# Patient Record
Sex: Male | Born: 1980
Health system: Southern US, Community
[De-identification: ages and names within clinical notes are randomized; demographics above are authoritative.]

## PROBLEM LIST (undated history)

## (undated) DIAGNOSIS — F411 Generalized anxiety disorder: Secondary | ICD-10-CM

## (undated) DIAGNOSIS — F319 Bipolar disorder, unspecified: Secondary | ICD-10-CM

## (undated) DIAGNOSIS — L0390 Cellulitis, unspecified: Secondary | ICD-10-CM

## (undated) DIAGNOSIS — F909 Attention-deficit hyperactivity disorder, unspecified type: Secondary | ICD-10-CM

## (undated) DIAGNOSIS — K5792 Diverticulitis of intestine, part unspecified, without perforation or abscess without bleeding: Secondary | ICD-10-CM

## (undated) HISTORY — PX: KNEE SURGERY: SHX244

## (undated) HISTORY — PX: FRACTURE SURGERY: SHX138

---

## 2005-01-24 ENCOUNTER — Emergency Department (HOSPITAL_COMMUNITY): Admission: EM | Admit: 2005-01-24 | Discharge: 2005-01-24 | Payer: Self-pay | Admitting: Emergency Medicine
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
## 2007-11-19 ENCOUNTER — Emergency Department (HOSPITAL_BASED_OUTPATIENT_CLINIC_OR_DEPARTMENT_OTHER): Admission: EM | Admit: 2007-11-19 | Discharge: 2007-11-19 | Payer: Self-pay | Admitting: Emergency Medicine

## 2008-04-15 ENCOUNTER — Emergency Department (HOSPITAL_BASED_OUTPATIENT_CLINIC_OR_DEPARTMENT_OTHER): Admission: EM | Admit: 2008-04-15 | Discharge: 2008-04-15 | Payer: Self-pay | Admitting: Emergency Medicine

## 2008-04-15 ENCOUNTER — Ambulatory Visit: Payer: Self-pay | Admitting: Radiology

## 2008-06-21 ENCOUNTER — Ambulatory Visit: Payer: Self-pay | Admitting: Diagnostic Radiology

## 2008-06-21 ENCOUNTER — Emergency Department (HOSPITAL_BASED_OUTPATIENT_CLINIC_OR_DEPARTMENT_OTHER): Admission: EM | Admit: 2008-06-21 | Discharge: 2008-06-21 | Payer: Self-pay | Admitting: Emergency Medicine

## 2008-07-03 ENCOUNTER — Ambulatory Visit: Payer: Self-pay | Admitting: Diagnostic Radiology

## 2008-07-03 ENCOUNTER — Emergency Department (HOSPITAL_BASED_OUTPATIENT_CLINIC_OR_DEPARTMENT_OTHER): Admission: EM | Admit: 2008-07-03 | Discharge: 2008-07-03 | Payer: Self-pay | Admitting: Emergency Medicine

## 2009-02-16 ENCOUNTER — Emergency Department (HOSPITAL_BASED_OUTPATIENT_CLINIC_OR_DEPARTMENT_OTHER): Admission: EM | Admit: 2009-02-16 | Discharge: 2009-02-16 | Payer: Self-pay | Admitting: Emergency Medicine

## 2009-02-16 ENCOUNTER — Ambulatory Visit: Payer: Self-pay | Admitting: Radiology

## 2009-05-19 ENCOUNTER — Emergency Department (HOSPITAL_BASED_OUTPATIENT_CLINIC_OR_DEPARTMENT_OTHER): Admission: EM | Admit: 2009-05-19 | Discharge: 2009-05-19 | Payer: Self-pay | Admitting: Emergency Medicine

## 2009-06-18 ENCOUNTER — Emergency Department (HOSPITAL_BASED_OUTPATIENT_CLINIC_OR_DEPARTMENT_OTHER): Admission: EM | Admit: 2009-06-18 | Discharge: 2009-06-18 | Payer: Self-pay | Admitting: Emergency Medicine

## 2010-05-22 LAB — CBC
MCHC: 34.5 g/dL (ref 30.0–36.0)
RBC: 5.13 MIL/uL (ref 4.22–5.81)
RDW: 12.3 % (ref 11.5–15.5)

## 2010-05-22 LAB — BASIC METABOLIC PANEL
CO2: 28 mEq/L (ref 19–32)
Calcium: 9.7 mg/dL (ref 8.4–10.5)
Creatinine, Ser: 1 mg/dL (ref 0.4–1.5)
GFR calc Af Amer: >60 mL/min (ref >60)
Glucose, Bld: 114 mg/dL — ABNORMAL HIGH (ref 70–99)

## 2010-05-22 LAB — DIFFERENTIAL
Basophils Absolute: 0 10*3/uL (ref 0.0–0.1)
Basophils Relative: 1 % (ref 0–1)
Neutro Abs: 4.7 10*3/uL (ref 1.7–7.7)
Neutrophils Relative %: 66 % (ref 43–77)

## 2010-05-22 LAB — POCT TOXICOLOGY PANEL

## 2010-05-22 LAB — POCT CARDIAC MARKERS
CKMB, poc: <1 ng/mL — ABNORMAL LOW (ref 1.0–8.0)
Myoglobin, poc: 46.5 ng/mL (ref 12–200)

## 2015-06-04 ENCOUNTER — Emergency Department (HOSPITAL_BASED_OUTPATIENT_CLINIC_OR_DEPARTMENT_OTHER)
Admission: EM | Admit: 2015-06-04 | Discharge: 2015-06-04 | Disposition: A | Discharge status: Home / Self Care | Payer: No Typology Code available for payment source | Attending: Emergency Medicine | Admitting: Emergency Medicine

## 2015-06-04 ENCOUNTER — Encounter (HOSPITAL_BASED_OUTPATIENT_CLINIC_OR_DEPARTMENT_OTHER): Payer: Self-pay | Admitting: *Deleted

## 2015-06-04 DIAGNOSIS — K0381 Cracked tooth: Secondary | ICD-10-CM | POA: Diagnosis not present

## 2015-06-04 DIAGNOSIS — F17200 Nicotine dependence, unspecified, uncomplicated: Secondary | ICD-10-CM | POA: Insufficient documentation

## 2015-06-04 DIAGNOSIS — K0889 Other specified disorders of teeth and supporting structures: Secondary | ICD-10-CM | POA: Insufficient documentation

## 2015-06-04 DIAGNOSIS — Z79899 Other long term (current) drug therapy: Secondary | ICD-10-CM | POA: Diagnosis not present

## 2015-06-04 MED ORDER — ONDANSETRON 4 MG PO TBDP
4.0000 mg | ORAL_TABLET | Freq: Once | ORAL | Status: AC
  Administered 2015-06-04: 4 mg via ORAL
  Filled 2015-06-04: qty 1

## 2015-06-04 MED ORDER — MORPHINE SULFATE (PF) 4 MG/ML IV SOLN
6.0000 mg | Freq: Once | INTRAVENOUS | Status: AC
  Administered 2015-06-04: 6 mg via INTRAMUSCULAR
  Filled 2015-06-04: qty 2

## 2015-06-04 MED ORDER — ACETAMINOPHEN 500 MG PO TABS
1000.0000 mg | ORAL_TABLET | Freq: Once | ORAL | Status: AC
  Administered 2015-06-04: 1000 mg via ORAL
  Filled 2015-06-04: qty 2

## 2015-06-04 MED ORDER — TRAMADOL HCL 50 MG PO TABS
50.0000 mg | ORAL_TABLET | Freq: Two times a day (BID) | ORAL | Status: DC | PRN
Start: 2015-06-04 — End: 2016-12-25

## 2015-06-04 MED ORDER — TRAMADOL HCL 50 MG PO TABS
100.0000 mg | ORAL_TABLET | Freq: Once | ORAL | Status: DC
  Filled 2015-06-04: qty 2

## 2015-06-04 NOTE — ED Notes (Signed)
MD with pt  

## 2015-06-04 NOTE — Discharge Instructions (Signed)
Dental Pain John Pierce, You are experiencing pain because your cracked tooth is now down to the nerve.  There is no infection there currently. Continue to take ibuprofen 800mg  every 8 hours for pain.  If the pain becomes severe, take tramadol.  See your dentist within 3 days for close follow up. If symptoms worsen, come back to the ED immediately. Thank you. Dental pain may be caused by many things, including:  Tooth decay (cavities or caries). Cavities cause the nerve of your tooth to be open to air and hot or cold temperatures. This can cause pain or discomfort.  Abscess or infection. A dental abscess is an area that is full of infected pus from a bacterial infection in the inner part of the tooth (pulp). It usually happens at the end of the tooth's root.  Injury.  An unknown reason (idiopathic). Your pain may be mild or severe. It may only happen when:  You are chewing.  You are exposed to hot or cold temperature.  You are eating or drinking sugary foods or beverages, such as:  Soda.  Candy. Your pain may also be there all of the time. HOME CARE Watch your dental pain for any changes. Do these things to lessen your discomfort:  Take medicines only as told by your dentist.  If your dentist tells you to take an antibiotic medicine, finish all of it even if you start to feel better.  Keep all follow-up visits as told by your dentist. This is important.  Do not apply heat to the outside of your face.  Rinse your mouth or gargle with salt water if told by your dentist. This helps with pain and swelling.  You can make salt water by adding  tsp of salt to 1 cup of warm water.  Apply ice to the painful area of your face:  Put ice in a plastic bag.  Place a towel between your skin and the bag.  Leave the ice on for 20 minutes, 2-3 times per day.  Avoid foods or drinks that cause you pain, such as:  Very hot or very cold foods or drinks.  Sweet or sugary foods or  drinks. GET HELP IF:  Your pain is not helped with medicines.  Your symptoms are worse.  You have new symptoms. GET HELP RIGHT AWAY IF:  You cannot open your mouth.  You are having trouble breathing or swallowing.  You have a fever.  Your face, neck, or jaw is puffy (swollen).   This information is not intended to replace advice given to you by your health care provider. Make sure you discuss any questions you have with your health care provider.   Document Released: 07/17/2007 Document Revised: 06/14/2014 Document Reviewed: 01/24/2014 Elsevier Interactive Patient Education [employer]2016 [employer]

## 2015-06-04 NOTE — ED Provider Notes (Signed)
CSN: [id number]     Arrival date & time 06/04/15  0430 History   First MD Initiated Contact with Patient 06/04/15 [phone]     Chief Complaint  Patient presents with  . Dental Pain     (Consider location/radiation/quality/duration/timing/severity/associated sxs/prior Treatment) HPI   John Pierce is a 35 y.o. male with no significant past medical history presenting today for dental pain. Patient states that he cracked his tooth earlier last night. He then began developing pain in the middle of the night when he woke up. He tried ibuprofen 800 mg without any significant relief. 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  Is it hard for him to get time off due to his work schedule. He denies any swelling or drainage of pus. He has no fevers. Patient is here for pain relief.    10 Systems reviewed and are negative for acute change except as noted in the HPI.     History reviewed. No pertinent past medical history. History reviewed. No pertinent past surgical history. No family history on file. Social History  Substance Use Topics  . Smoking status: Current Every Day Smoker  . Smokeless tobacco: None  . Alcohol Use: Yes     Comment: occasional     Review of Systems    Allergies  Review of patient's allergies indicates not on file.  Home Medications   Prior to Admission medications   Medication Sig Start Date End Date Taking? Authorizing Provider  ALPRAZolam (XANAX XR) 0.5 MG 24 hr tablet Take 0.5 mg by mouth as needed for anxiety (as needed).   Yes Historical Provider, MD  amphetamine-dextroamphetamine (ADDERALL XR) 30 MG 24 hr capsule Take 30 mg by mouth daily.   Yes Historical Provider, MD  traMADol (ULTRAM) 50 MG tablet Take 1 tablet (50 mg total) by mouth every 12 (twelve) hours as needed for severe pain. 06/04/15   Tomasita CrumbleAdeleke Anuradha Chabot, MD   BP 166/101 mmHg  Pulse 60  Temp(Src) 97.7 F (36.5 C) (Oral)  Resp 22  Ht 5\' 8"  (1.727 m)  Wt 165 lb (74.844 kg)   BMI 25.09 kg/m2  SpO2 99% Physical Exam  Constitutional: He is oriented to person, place, and time. Vital signs are normal. He appears well-developed and well-nourished.  Non-toxic appearance. He does not appear ill. He appears distressed.  HENT:  Head: Normocephalic and atraumatic.  Nose: Nose normal.  Mouth/Throat: Oropharynx is clear and moist. No oropharyngeal exudate.  Overall poor dentition. Tooth #30 is cracked but I do not see any pulp exposure. There is no swelling or abscess formation.  Eyes: Conjunctivae and EOM are normal. Pupils are equal, round, and reactive to light. No scleral icterus.  Neck: Normal range of motion. Neck supple. No tracheal deviation, no edema, no erythema and normal range of motion present. No thyroid mass and no thyromegaly present.  Cardiovascular: Normal rate, regular rhythm, S1 normal, S2 normal, normal heart sounds, intact distal pulses and normal pulses.  Exam reveals no gallop and no friction rub.   No murmur heard. Pulmonary/Chest: Effort normal and breath sounds normal. No respiratory distress. He has no wheezes. He has no rhonchi. He has no rales.  Abdominal: Soft. Normal appearance and bowel sounds are normal. He exhibits no distension, no ascites and no mass. There is no hepatosplenomegaly. There is no tenderness. There is no rebound, no guarding and no CVA tenderness.  Musculoskeletal: Normal range of motion. He exhibits no edema or tenderness.  Lymphadenopathy:  He has no cervical adenopathy.  Neurological: He is alert and oriented to person, place, and time. He has normal strength. No cranial nerve deficit or sensory deficit.  Skin: Skin is warm, dry and intact. No petechiae and no rash noted. He is not diaphoretic. No erythema. No pallor.  Psychiatric: He has a normal mood and affect. His behavior is normal. Judgment normal.  Nursing note and vitals reviewed.   ED Course  Procedures (including critical care time) Labs Review Labs Reviewed  - No data to display  Imaging Review No results found. I have personally reviewed and evaluated these images and lab results as part of my medical decision-making.   EKG Interpretation None      MDM   Final diagnoses:  Pain, dental    Patient presents to the ED for dental pain. Likely due to chipped tooth tonight.  He has good dental follow up.  He was given IM morphine and tylenol in the ED.  Will DC with tramadol for breakthrough pain.  I encouraged motrin as a first line.  Dental follow up advised within 3 days. He appears well and in NAD.  VS remain within his normal limits, he is asymptomatic from his high BP, patient is safe for DC.  Tomasita Crumble, MD 06/04/15 [phone]

## 2015-06-04 NOTE — ED Notes (Addendum)
C/o right tooth that broke yesterday. States he awoke to pain this morning. Pt. States that he does have dentist. States that he took some ibuprofen within the past couple hours. Denies any fevers. Pt is moaning and in obvious pain on exam.

## 2015-07-25 ENCOUNTER — Emergency Department (HOSPITAL_BASED_OUTPATIENT_CLINIC_OR_DEPARTMENT_OTHER)
Admission: EM | Admit: 2015-07-25 | Discharge: 2015-07-25 | Disposition: A | Discharge status: Home / Self Care | Payer: No Typology Code available for payment source | Attending: Emergency Medicine | Admitting: Emergency Medicine

## 2015-07-25 ENCOUNTER — Encounter (HOSPITAL_BASED_OUTPATIENT_CLINIC_OR_DEPARTMENT_OTHER): Payer: Self-pay | Admitting: Emergency Medicine

## 2015-07-25 DIAGNOSIS — F17210 Nicotine dependence, cigarettes, uncomplicated: Secondary | ICD-10-CM | POA: Diagnosis not present

## 2015-07-25 DIAGNOSIS — L03116 Cellulitis of left lower limb: Secondary | ICD-10-CM | POA: Diagnosis not present

## 2015-07-25 DIAGNOSIS — K0889 Other specified disorders of teeth and supporting structures: Secondary | ICD-10-CM | POA: Insufficient documentation

## 2015-07-25 HISTORY — DX: Cellulitis, unspecified: L03.90

## 2015-07-25 HISTORY — DX: Attention-deficit hyperactivity disorder, unspecified type: F90.9

## 2015-07-25 HISTORY — DX: Diverticulitis of intestine, part unspecified, without perforation or abscess without bleeding: K57.92

## 2015-07-25 LAB — CBC WITH DIFFERENTIAL/PLATELET
Basophils Absolute: 0 10*3/uL (ref 0.0–0.1)
Basophils Relative: 0 %
EOS ABS: 0 10*3/uL (ref 0.0–0.7)
Eosinophils Relative: 0 %
HEMATOCRIT: 39.7 % (ref 39.0–52.0)
HEMOGLOBIN: 13.2 g/dL (ref 13.0–17.0)
LYMPHS ABS: 1.3 10*3/uL (ref 0.7–4.0)
LYMPHS PCT: 10 %
MCH: 29.9 pg (ref 26.0–34.0)
MCHC: 33.2 g/dL (ref 30.0–36.0)
MCV: 89.8 fL (ref 78.0–100.0)
MONOS PCT: 7 %
Monocytes Absolute: 1 10*3/uL (ref 0.1–1.0)
NEUTROS PCT: 83 %
Neutro Abs: 11.2 10*3/uL — ABNORMAL HIGH (ref 1.7–7.7)
Platelets: 264 10*3/uL (ref 150–400)
RBC: 4.42 MIL/uL (ref 4.22–5.81)
RDW: 14.5 % (ref 11.5–15.5)
WBC: 13.5 10*3/uL — ABNORMAL HIGH (ref 4.0–10.5)

## 2015-07-25 LAB — BASIC METABOLIC PANEL
Anion gap: 6 (ref 5–15)
BUN: 18 mg/dL (ref 6–20)
CHLORIDE: 100 mmol/L — AB (ref 101–111)
CO2: 26 mmol/L (ref 22–32)
CREATININE: 0.99 mg/dL (ref 0.61–1.24)
Calcium: 8.6 mg/dL — ABNORMAL LOW (ref 8.9–10.3)
GFR calc Af Amer: >60 mL/min (ref >60)
GFR calc non Af Amer: >60 mL/min (ref >60)
GLUCOSE: 106 mg/dL — AB (ref 65–99)
POTASSIUM: 3.7 mmol/L (ref 3.5–5.1)
SODIUM: 132 mmol/L — AB (ref 135–145)

## 2015-07-25 MED ORDER — KETOROLAC TROMETHAMINE 30 MG/ML IJ SOLN
30.0000 mg | Freq: Once | INTRAMUSCULAR | Status: AC
  Administered 2015-07-25: 30 mg via INTRAVENOUS
  Filled 2015-07-25: qty 1

## 2015-07-25 MED ORDER — DOXYCYCLINE HYCLATE 100 MG PO TABS
100.0000 mg | ORAL_TABLET | Freq: Once | ORAL | Status: AC
  Administered 2015-07-25: 100 mg via ORAL
  Filled 2015-07-25: qty 1

## 2015-07-25 MED ORDER — DOXYCYCLINE HYCLATE 100 MG PO CAPS: 100.0000 mg | ORAL_CAPSULE | Freq: Two times a day (BID) | ORAL | Status: DC

## 2015-07-25 MED ORDER — ACETAMINOPHEN 500 MG PO TABS
1000.0000 mg | ORAL_TABLET | Freq: Once | ORAL | Status: AC
  Administered 2015-07-25: 1000 mg via ORAL
  Filled 2015-07-25: qty 2

## 2015-07-25 MED ORDER — SODIUM CHLORIDE 0.9 % IV BOLUS (SEPSIS)
1000.0000 mL | Freq: Once | INTRAVENOUS | Status: AC
  Administered 2015-07-25: 1000 mL via INTRAVENOUS

## 2015-07-25 MED FILL — DOXYCYCLINE HYC 100 MG CAP: 100 | 10 days supply | Qty: 20 | Fill #0

## 2015-07-25 NOTE — Discharge Instructions (Signed)

## 2015-07-25 NOTE — ED Notes (Signed)
Pt is here with pain in left lower leg, mild redness and much tenderness in left lower leg.  Pt has hx of cellulitis and states that this is the same, he states that the symptoms began on Saturday and he has body aches and fever with this at this time.

## 2015-07-25 NOTE — ED Provider Notes (Signed)
CSN: [id number]     Arrival date & time 07/25/15  [phone] History   First MD Initiated Contact with Patient 07/25/15 [phone]     Chief Complaint  Patient presents with  . Cellulitis      HPI  Patient presents for evaluation with concern of an infection in his leg.  Since Saturday, 2 days ago he's had fever shakes and chills. He's had some progressive redness and tenderness in his left lower leg. He's had cellulitis in that leg 2 times before and is concerned this may be the same thing. He states he has diffuse bodyaches and said shakes and chills during the night.  He had a tooth extracted a week ago and remains on amoxicillin. He states his throat is somewhat sore reveals it is from drainage from the tooth extraction. No neck pain or stiffness. No cough or restaurant complaints. No GI complaints. Leg is not swollen. However area of redness and discomfort  History reviewed. No pertinent past medical history. History reviewed. No pertinent past surgical history. History reviewed. No pertinent family history. Social History  Substance Use Topics  . Smoking status: Current Every Day Smoker  . Smokeless tobacco: None  . Alcohol Use: Yes     Comment: occasional     Review of Systems  Constitutional: Negative for fever, chills, diaphoresis, appetite change and fatigue.  HENT: Positive for dental problem. Negative for mouth sores, sore throat and trouble swallowing.   Eyes: Negative for visual disturbance.  Respiratory: Negative for cough, chest tightness, shortness of breath and wheezing.   Cardiovascular: Negative for chest pain.  Gastrointestinal: Negative for nausea, vomiting, abdominal pain, diarrhea and abdominal distention.  Endocrine: Negative for polydipsia, polyphagia and polyuria.  Genitourinary: Negative for dysuria, frequency and hematuria.  Musculoskeletal: Negative for gait problem.  Skin: Positive for color change and rash. Negative for pallor.  Neurological: Negative for  dizziness, syncope, light-headedness and headaches.  Hematological: Does not bruise/bleed easily.  Psychiatric/Behavioral: Negative for behavioral problems and confusion.      Allergies  Review of patient's allergies indicates not on file.  Home Medications   Prior to Admission medications   Medication Sig Start Date End Date Taking? Authorizing Provider  ALPRAZolam (XANAX XR) 0.5 MG 24 hr tablet Take 0.5 mg by mouth as needed for anxiety (as needed).    Historical Provider, MD  amphetamine-dextroamphetamine (ADDERALL XR) 30 MG 24 hr capsule Take 30 mg by mouth daily.    Historical Provider, MD  doxycycline (VIBRAMYCIN) 100 MG capsule Take 1 capsule (100 mg total) by mouth 2 (two) times daily. 07/25/15   Rolland PorterMark Sunny Aguon, MD  traMADol (ULTRAM) 50 MG tablet Take 1 tablet (50 mg total) by mouth every 12 (twelve) hours as needed for severe pain. 06/04/15   Tomasita CrumbleAdeleke Oni, MD   BP 130/70 mmHg  Pulse 91  Temp(Src) 101.4 F (38.6 C) (Oral)  Resp 18  Ht 5\' 8"  (1.727 m)  Wt 165 lb (74.844 kg)  BMI 25.09 kg/m2  SpO2 98% Physical Exam  Constitutional: He is oriented to person, place, and time. He appears well-developed and well-nourished.  Non-toxic appearance. He does not have a sickly appearance. He does not appear ill. No distress.  Awake and alert. Conversational. Does not appear frankly ill or distressed  HENT:  Head: Normocephalic.  Mouth/Throat:    Eyes: Conjunctivae are normal. Pupils are equal, round, and reactive to light. No scleral icterus.  Neck: Normal range of motion. Neck supple. No thyromegaly present.  Cardiovascular: Normal  rate and regular rhythm.  Exam reveals no gallop and no friction rub.   No murmur heard. Pulmonary/Chest: Effort normal and breath sounds normal. No respiratory distress. He has no wheezes. He has no rales.  Abdominal: Soft. Bowel sounds are normal. He exhibits no distension. There is no tenderness. There is no rebound.  Musculoskeletal: Normal range of  motion.       Legs: Neurological: He is alert and oriented to person, place, and time.  Skin: Skin is warm and dry. No rash noted.  Psychiatric: He has a normal mood and affect. His behavior is normal.    ED Course  Procedures (including critical care time) Labs Review Labs Reviewed  CBC WITH DIFFERENTIAL/PLATELET  BASIC METABOLIC PANEL    Imaging Review No results found. I have personally reviewed and evaluated these images and lab results as part of my medical decision-making.   EKG Interpretation None      MDM   Final diagnoses:  Cellulitis of left lower extremity    An apparent left lower extremity cellulitis. Patient has been treated for this twice previous. He states that one point a med Center gave him an oral antibiotic that started with a "C" that gave him a rash. It sounds like this may have been cephalexin.  I am hesitant to use Rocephin. He developed this current infection while taking the penicillin.  Plan fluids, lab checks, Tylenol oral fluids. By mouth doxycycline.    Rolland Porter, MD 07/25/15 [phone]

## 2015-07-27 ENCOUNTER — Emergency Department (HOSPITAL_BASED_OUTPATIENT_CLINIC_OR_DEPARTMENT_OTHER)
Admission: EM | Admit: 2015-07-27 | Discharge: 2015-07-27 | Disposition: A | Discharge status: Home / Self Care | Payer: No Typology Code available for payment source | Attending: Emergency Medicine | Admitting: Emergency Medicine

## 2015-07-27 ENCOUNTER — Encounter (HOSPITAL_BASED_OUTPATIENT_CLINIC_OR_DEPARTMENT_OTHER): Payer: Self-pay | Admitting: *Deleted

## 2015-07-27 DIAGNOSIS — F17210 Nicotine dependence, cigarettes, uncomplicated: Secondary | ICD-10-CM | POA: Diagnosis not present

## 2015-07-27 DIAGNOSIS — R112 Nausea with vomiting, unspecified: Secondary | ICD-10-CM | POA: Insufficient documentation

## 2015-07-27 DIAGNOSIS — M79605 Pain in left leg: Secondary | ICD-10-CM | POA: Insufficient documentation

## 2015-07-27 LAB — CBC WITH DIFFERENTIAL/PLATELET
BASOS ABS: 0 10*3/uL (ref 0.0–0.1)
BASOS PCT: 0 %
EOS ABS: 0 10*3/uL (ref 0.0–0.7)
EOS PCT: 0 %
HCT: 39.5 % (ref 39.0–52.0)
Hemoglobin: 13.2 g/dL (ref 13.0–17.0)
Lymphocytes Relative: 11 %
Lymphs Abs: 1.3 10*3/uL (ref 0.7–4.0)
MCH: 29.5 pg (ref 26.0–34.0)
MCHC: 33.4 g/dL (ref 30.0–36.0)
MCV: 88.4 fL (ref 78.0–100.0)
MONO ABS: 0.9 10*3/uL (ref 0.1–1.0)
Monocytes Relative: 8 %
NEUTROS ABS: 9.8 10*3/uL — AB (ref 1.7–7.7)
Neutrophils Relative %: 81 %
PLATELETS: 288 10*3/uL (ref 150–400)
RBC: 4.47 MIL/uL (ref 4.22–5.81)
RDW: 14.3 % (ref 11.5–15.5)
WBC: 12 10*3/uL — AB (ref 4.0–10.5)

## 2015-07-27 NOTE — ED Notes (Signed)
States he was treated for cellulitis in his left lower leg 2 days ago. Here today with nausea, vomiting and body aches.

## 2015-07-27 NOTE — ED Provider Notes (Signed)
CSN: [id number]     Arrival date & time 07/27/15  1153 History   First MD Initiated Contact with Patient 07/27/15 1253     Chief Complaint  Patient presents with  . Leg Pain     (Consider location/radiation/quality/duration/timing/severity/associated sxs/prior Treatment) Patient is a 35 y.o. male presenting with leg pain. The history is provided by the patient. No language interpreter was used.  Leg Pain Location:  Leg Time since incident:  2 days Injury: no   Leg location:  L leg Pain details:    Quality:  Aching   Radiates to:  Does not radiate   Severity:  Moderate   Onset quality:  Gradual   Duration:  4 days   Timing:  Constant   Progression:  Worsening Chronicity:  New Dislocation: no   Relieved by:  Nothing Worsened by:  Nothing tried Ineffective treatments:  None tried   Past Medical History  Diagnosis Date  . Cellulitis   . Diverticulitis   . ADHD (attention deficit hyperactivity disorder)    History reviewed. No pertinent past surgical history. No family history on file. Social History  Substance Use Topics  . Smoking status: Current Every Day Smoker -- 1.00 packs/day for 15 years    Types: Cigarettes  . Smokeless tobacco: None  . Alcohol Use: Yes     Comment: occasional     Review of Systems  All other systems reviewed and are negative.     Allergies  Keflex  Home Medications   Prior to Admission medications   Medication Sig Start Date End Date Taking? Authorizing Provider  ALPRAZolam (XANAX XR) 0.5 MG 24 hr tablet Take 0.5 mg by mouth as needed for anxiety (as needed).    Historical Provider, MD  amphetamine-dextroamphetamine (ADDERALL XR) 30 MG 24 hr capsule Take 30 mg by mouth daily.    Historical Provider, MD  doxycycline (VIBRAMYCIN) 100 MG capsule Take 1 capsule (100 mg total) by mouth 2 (two) times daily. 07/25/15   Rolland Porter, MD  traMADol (ULTRAM) 50 MG tablet Take 1 tablet (50 mg total) by mouth every 12 (twelve) hours as needed for  severe pain. 06/04/15   Tomasita Crumble, MD   BP 136/87 mmHg  Pulse 78  Temp(Src) 99.6 F (37.6 C) (Oral)  Resp 16  Ht  (1.727 m)  Wt 74.844 kg  BMI 25.09 kg/m2  SpO2 100% Physical Exam  Constitutional: He is oriented to person, place, and time. He appears well-developed and well-nourished.  HENT:  Head: Normocephalic.  Eyes: EOM are normal.  Neck: Normal range of motion.  Cardiovascular: Normal rate.   Pulmonary/Chest: Effort normal.  Abdominal: He exhibits no distension.  Musculoskeletal: Normal range of motion.  No sign of cellulitis  No swelling,  Pt reports area looks better  Neurological: He is alert and oriented to person, place, and time.  Skin: Skin is warm.  Psychiatric: He has a normal mood and affect.  Nursing note and vitals reviewed.   ED Course  Procedures (including critical care time) Labs Review Labs Reviewed  CBC WITH DIFFERENTIAL/PLATELET - Abnormal; Notable for the following:    WBC 12.0 (*)    Neutro Abs 9.8 (*)    All other components within normal limits    Imaging Review No results found. I have personally reviewed and evaluated these images and lab results as part of my medical decision-making.   EKG Interpretation None      MDM Pt has improving wbc count.  I do  not see in current redness.  Pt is advised to continue current antibiotics.    Final diagnoses:  Leg pain, left    An After Visit Summary was printed and given to the patient.    Lonia SkinnerLeslie K HamtramckSofia, PA-C 07/27/15 1511  Gwyneth SproutWhitney Plunkett, MD 07/27/15 2142

## 2015-07-27 NOTE — Discharge Instructions (Signed)

## 2016-12-25 ENCOUNTER — Encounter (HOSPITAL_BASED_OUTPATIENT_CLINIC_OR_DEPARTMENT_OTHER): Payer: Self-pay | Admitting: Emergency Medicine

## 2016-12-25 ENCOUNTER — Emergency Department (HOSPITAL_BASED_OUTPATIENT_CLINIC_OR_DEPARTMENT_OTHER): Payer: PRIVATE HEALTH INSURANCE

## 2016-12-25 ENCOUNTER — Emergency Department (HOSPITAL_BASED_OUTPATIENT_CLINIC_OR_DEPARTMENT_OTHER)
Admission: EM | Admit: 2016-12-25 | Discharge: 2016-12-25 | Disposition: A | Discharge status: Home / Self Care | Payer: PRIVATE HEALTH INSURANCE | Attending: Emergency Medicine | Admitting: Emergency Medicine

## 2016-12-25 ENCOUNTER — Other Ambulatory Visit: Payer: Self-pay

## 2016-12-25 DIAGNOSIS — M25572 Pain in left ankle and joints of left foot: Secondary | ICD-10-CM | POA: Insufficient documentation

## 2016-12-25 DIAGNOSIS — Z79899 Other long term (current) drug therapy: Secondary | ICD-10-CM | POA: Diagnosis not present

## 2016-12-25 DIAGNOSIS — F17210 Nicotine dependence, cigarettes, uncomplicated: Secondary | ICD-10-CM | POA: Diagnosis not present

## 2016-12-25 HISTORY — DX: Bipolar disorder, unspecified: F31.9

## 2016-12-25 HISTORY — DX: Generalized anxiety disorder: F41.1

## 2016-12-25 MED ORDER — OXYCODONE-ACETAMINOPHEN 5-325 MG PO TABS
2.0000 | ORAL_TABLET | Freq: Once | ORAL | Status: AC
  Administered 2016-12-25: 2 via ORAL
  Filled 2016-12-25: qty 2

## 2016-12-25 MED ORDER — ONDANSETRON 8 MG PO TBDP
8.0000 mg | ORAL_TABLET | Freq: Once | ORAL | Status: AC
  Administered 2016-12-25: 8 mg via ORAL
  Filled 2016-12-25: qty 1

## 2016-12-25 MED ORDER — TRAMADOL HCL 50 MG PO TABS: 50.0000 mg | ORAL_TABLET | Freq: Four times a day (QID) | ORAL | 0 refills | Status: DC | PRN

## 2016-12-25 NOTE — ED Provider Notes (Addendum)
[facility] EMERGENCY DEPARTMENT Provider Note   CSN: [id number] Arrival date & time: 12/25/16  0309     History   Chief Complaint Chief Complaint  Patient presents with  . Ankle Pain    HPI John Pierce is a 36 y.o. male.  Patient presents to the emergency department for evaluation of left ankle pain.  Patient reports that he injured his ankle 1 month ago.      Past Medical History:  Diagnosis Date  . ADHD (attention deficit hyperactivity disorder)   . Bipolar 1 disorder (HCC)   . Cellulitis   . Diverticulitis   . GAD (generalized anxiety disorder)     There are no active problems to display for this patient.   History reviewed. No pertinent surgical history.     Home Medications    Prior to Admission medications   Medication Sig Start Date End Date Taking? Authorizing Provider  ALPRAZolam (XANAX XR) 0.5 MG 24 hr tablet Take 0.5 mg 3 (three) times daily by mouth.    Yes [provider]  amphetamine-dextroamphetamine (ADDERALL XR) 30 MG 24 hr capsule Take 30 mg 2 (two) times daily by mouth. 1 tab in AM, 1 tab at noon, and half tab afternoon   Yes [provider]  ibuprofen (ADVIL,MOTRIN) 400 MG tablet Take 400 mg every 6 (six) hours as needed by mouth.   Yes [provider]    Family History No family history on file.  Social History Social History   Tobacco Use  . Smoking status: Current Every Day Smoker    Packs/day: 1.00    Years: 15.00    Pack years: 15.00    Types: Cigarettes  . Smokeless tobacco: Never Used  Substance Use Topics  . Alcohol use: Yes    Comment: occasional   . Drug use: No     Allergies   Keflex [cephalexin]   Review of Systems Review of Systems  Musculoskeletal: Positive for arthralgias.     Physical Exam Updated Vital Signs BP 118/81 (BP Location: Right Arm)   Pulse 86   Temp 97.8 F (36.6 C) (Oral)   Resp 18   Ht 5\' 8"  (1.727 m)   Wt 77.1 kg (170 lb)   SpO2 99%    BMI 25.85 kg/m   Physical Exam  Constitutional: He appears well-developed and well-nourished.  Eyes: EOM are normal. Pupils are equal, round, and reactive to light.  Pulmonary/Chest: Effort normal.  Musculoskeletal:       Left ankle: He exhibits swelling. He exhibits normal range of motion. Tenderness.     ED Treatments / Results  Labs (all labs ordered are listed, but only abnormal results are displayed) Labs Reviewed - No data to display  EKG  EKG Interpretation None       Radiology No results found.  Procedures Procedures (including critical care time)  Medications Ordered in ED Medications  oxyCODONE-acetaminophen (PERCOCET/ROXICET) 5-325 MG per tablet 2 tablet (2 tablets Oral Given 12/25/16 0332)  ondansetron (ZOFRAN-ODT) disintegrating tablet 8 mg (8 mg Oral Given 12/25/16 0332)     Initial Impression / Assessment and Plan / ED Course  I have reviewed the triage vital signs and the nursing notes.  Pertinent labs & imaging results that were available during my care of the patient were reviewed by me and considered in my medical decision making (see chart for details).     Patient presents to the ER for evaluation of patient had an injury approximately 1 month  ago.  He was seen by orthopedics, told he had ligamentous injury in the ankle.  He was in a walking boot.  He had been progressively increasing his ambulation, reports that he was doing well.  Tonight, however, he started having increasing pain.  No new injury.  Examination reveals some slight swelling of the ankle with diffuse tenderness.  There is no erythema or warmth.  I would still consider the possibility of gout.  No evidence of a septic joint.  X-ray unremarkable.  Final Clinical Impressions(s) / ED Diagnoses   Final diagnoses:  Arthralgia of ankle, left    ED Discharge Orders    None       Pollina, Canary Brimhristopher J, MD 12/25/16 [phone]    Gilda CreasePollina, Christopher J, MD 01/14/17 [phone]

## 2016-12-25 NOTE — ED Triage Notes (Signed)
Pt has injury to left ankle x 1 month. Pt reports significant worsening in pain tonight with no known injury.

## 2017-06-30 ENCOUNTER — Emergency Department (HOSPITAL_BASED_OUTPATIENT_CLINIC_OR_DEPARTMENT_OTHER): Payer: PRIVATE HEALTH INSURANCE

## 2017-06-30 ENCOUNTER — Emergency Department (HOSPITAL_BASED_OUTPATIENT_CLINIC_OR_DEPARTMENT_OTHER)
Admission: EM | Admit: 2017-06-30 | Discharge: 2017-06-30 | Disposition: A | Discharge status: Home / Self Care | Payer: PRIVATE HEALTH INSURANCE | Attending: Physician Assistant | Admitting: Physician Assistant

## 2017-06-30 ENCOUNTER — Encounter (HOSPITAL_BASED_OUTPATIENT_CLINIC_OR_DEPARTMENT_OTHER): Payer: Self-pay | Admitting: Emergency Medicine

## 2017-06-30 ENCOUNTER — Other Ambulatory Visit: Payer: Self-pay

## 2017-06-30 DIAGNOSIS — Z79899 Other long term (current) drug therapy: Secondary | ICD-10-CM | POA: Diagnosis not present

## 2017-06-30 DIAGNOSIS — R109 Unspecified abdominal pain: Secondary | ICD-10-CM | POA: Insufficient documentation

## 2017-06-30 DIAGNOSIS — F17210 Nicotine dependence, cigarettes, uncomplicated: Secondary | ICD-10-CM | POA: Insufficient documentation

## 2017-06-30 LAB — BASIC METABOLIC PANEL
Anion gap: 9 (ref 5–15)
BUN: 21 mg/dL — ABNORMAL HIGH (ref 6–20)
CHLORIDE: 106 mmol/L (ref 101–111)
CO2: 24 mmol/L (ref 22–32)
CREATININE: 1.1 mg/dL (ref 0.61–1.24)
Calcium: 8.8 mg/dL — ABNORMAL LOW (ref 8.9–10.3)
GFR calc Af Amer: >60 mL/min (ref >60)
GFR calc non Af Amer: >60 mL/min (ref >60)
Glucose, Bld: 101 mg/dL — ABNORMAL HIGH (ref 65–99)
Potassium: 4.2 mmol/L (ref 3.5–5.1)
SODIUM: 139 mmol/L (ref 135–145)

## 2017-06-30 LAB — CBC
HCT: 43.8 % (ref 39.0–52.0)
HEMOGLOBIN: 14.9 g/dL (ref 13.0–17.0)
MCH: 30.1 pg (ref 26.0–34.0)
MCHC: 34 g/dL (ref 30.0–36.0)
MCV: 88.5 fL (ref 78.0–100.0)
Platelets: 324 10*3/uL (ref 150–400)
RBC: 4.95 MIL/uL (ref 4.22–5.81)
RDW: 14.9 % (ref 11.5–15.5)
WBC: 11.4 10*3/uL — AB (ref 4.0–10.5)

## 2017-06-30 LAB — URINALYSIS, ROUTINE W REFLEX MICROSCOPIC
Bilirubin Urine: NEGATIVE
Glucose, UA: NEGATIVE mg/dL
Hgb urine dipstick: NEGATIVE
KETONES UR: NEGATIVE mg/dL
NITRITE: NEGATIVE
Protein, ur: NEGATIVE mg/dL
Specific Gravity, Urine: >1.03 — ABNORMAL HIGH (ref 1.005–1.030)
pH: 5.5 (ref 5.0–8.0)

## 2017-06-30 LAB — URINALYSIS, MICROSCOPIC (REFLEX)

## 2017-06-30 MED ORDER — CIPROFLOXACIN HCL 500 MG PO TABS
500.0000 mg | ORAL_TABLET | Freq: Once | ORAL | Status: AC
  Administered 2017-06-30: 500 mg via ORAL
  Filled 2017-06-30: qty 1

## 2017-06-30 MED ORDER — KETOROLAC TROMETHAMINE 30 MG/ML IJ SOLN
30.0000 mg | Freq: Once | INTRAMUSCULAR | Status: AC
  Administered 2017-06-30: 30 mg via INTRAVENOUS

## 2017-06-30 MED ORDER — CYCLOBENZAPRINE HCL 10 MG PO TABS: 10.0000 mg | ORAL_TABLET | Freq: Two times a day (BID) | ORAL | 0 refills | Status: DC | PRN

## 2017-06-30 MED ORDER — CIPROFLOXACIN HCL 500 MG PO TABS: 500.0000 mg | ORAL_TABLET | Freq: Two times a day (BID) | ORAL | 0 refills | Status: DC

## 2017-06-30 MED ORDER — ONDANSETRON HCL 4 MG/2ML IJ SOLN
INTRAMUSCULAR | Status: AC
  Administered 2017-06-30: 4 mg via INTRAVENOUS
  Filled 2017-06-30: qty 2

## 2017-06-30 MED ORDER — KETOROLAC TROMETHAMINE 30 MG/ML IJ SOLN
INTRAMUSCULAR | Status: AC
  Administered 2017-06-30: 30 mg via INTRAVENOUS
  Filled 2017-06-30: qty 1

## 2017-06-30 MED ORDER — TRAMADOL HCL 50 MG PO TABS: 50.0000 mg | ORAL_TABLET | Freq: Four times a day (QID) | ORAL | 0 refills | Status: AC | PRN

## 2017-06-30 MED ORDER — ONDANSETRON HCL 4 MG/2ML IJ SOLN
4.0000 mg | Freq: Once | INTRAMUSCULAR | Status: AC
  Administered 2017-06-30: 4 mg via INTRAVENOUS

## 2017-06-30 MED FILL — CYCLOBENZAPRINE HCL 10 MG T: 10 | 5 days supply | Qty: 10 | Fill #0

## 2017-06-30 MED FILL — traMADol HCL 50 MG TABS: 50 | 2 days supply | Qty: 7 | Fill #0

## 2017-06-30 MED FILL — CIPROFLOXACIN HCL 500 MG TA: 500 | 14 days supply | Qty: 28 | Fill #0

## 2017-06-30 NOTE — ED Provider Notes (Signed)
[facility] EMERGENCY DEPARTMENT Provider Note   CSN: [id number] Arrival date & time: 06/30/17  0715     History   Chief Complaint Chief Complaint  Patient presents with  . Flank Pain    HPI John Pierce is a 37 y.o. male.  HPI   Patient is a 37 year old male with past medical history significant for ADHD and bipolar presenting with right back pain.  Patient reports is been building a shed so he thought that his back pain was secondary to that.  Noticed that it is been on for couple days.  But got worse this morning.  This morning felt more like a hot knife stabbing his right back.  Brought him here to the emergency department.  No nausea no vomiting.  Patient's ex-wife had kidney stone so he is worried that it may be kidney stones.  It is worse with movement.  Has noticed no urinary difficulties.  Past Medical History:  Diagnosis Date  . ADHD (attention deficit hyperactivity disorder)   . Bipolar 1 disorder (HCC)   . Cellulitis   . Diverticulitis   . GAD (generalized anxiety disorder)     There are no active problems to display for this patient.   History reviewed. No pertinent surgical history.      Home Medications    Prior to Admission medications   Medication Sig Start Date End Date Taking? Authorizing Provider  ALPRAZolam (XANAX XR) 0.5 MG 24 hr tablet Take 0.5 mg 3 (three) times daily by mouth.     [provider]  amphetamine-dextroamphetamine (ADDERALL XR) 30 MG 24 hr capsule Take 30 mg 2 (two) times daily by mouth. 1 tab in AM, 1 tab at noon, and half tab afternoon    [provider]  ciprofloxacin (CIPRO) 500 MG tablet Take 1 tablet (500 mg total) by mouth 2 (two) times daily. 06/30/17   Jan Olano Lyn, MD  cyclobenzaprine (FLEXERIL) 10 MG tablet Take 1 tablet (10 mg total) by mouth 2 (two) times daily as needed for muscle spasms. 06/30/17   Timia Casselman Lyn, MD  ibuprofen (ADVIL,MOTRIN) 400 MG tablet Take 400 mg  every 6 (six) hours as needed by mouth.    [provider]  traMADol (ULTRAM) 50 MG tablet Take 1 tablet (50 mg total) every 6 (six) hours as needed by mouth. 12/25/16   Pollina, Canary Brim, MD  traMADol (ULTRAM) 50 MG tablet Take 1 tablet (50 mg total) by mouth every 6 (six) hours as needed for up to 2 days. 06/30/17 07/02/17  Merwyn Hodapp, Cindee Salt, MD    Family History History reviewed. No pertinent family history.  Social History Social History   Tobacco Use  . Smoking status: Current Every Day Smoker    Packs/day: 1.00    Years: 15.00    Pack years: 15.00    Types: Cigarettes  . Smokeless tobacco: Never Used  Substance Use Topics  . Alcohol use: Yes    Comment: occasional   . Drug use: No     Allergies   Keflex [cephalexin]   Review of Systems Review of Systems  Constitutional: Negative for activity change.  Respiratory: Negative for shortness of breath.   Cardiovascular: Negative for chest pain.  Gastrointestinal: Negative for abdominal pain.  Genitourinary: Positive for flank pain.  All other systems reviewed and are negative.    Physical Exam Updated Vital Signs BP (!) 147/90 (BP Location: Right Arm)   Pulse 72   Temp 98.1 F (36.7 C) (  Oral)   Resp 18   Ht  (1.727 m)   Wt 79.4 kg (175 lb)   SpO2 100%   BMI 26.61 kg/m   Physical Exam  Constitutional: He is oriented to person, place, and time. He appears well-nourished.  HENT:  Head: Normocephalic.  Eyes: Conjunctivae are normal.  Cardiovascular: Normal rate and regular rhythm.  Pulmonary/Chest: Effort normal and breath sounds normal.  Abdominal: Soft. Bowel sounds are normal. He exhibits no distension. There is no tenderness.  Musculoskeletal:  There is palpation over the right CVA area.  Neurological: He is oriented to person, place, and time.  Skin: Skin is warm and dry. He is not diaphoretic.  Psychiatric: He has a normal mood and affect. His behavior is normal.     ED  Treatments / Results  Labs (all labs ordered are listed, but only abnormal results are displayed) Labs Reviewed  URINALYSIS, ROUTINE W REFLEX MICROSCOPIC - Abnormal; Notable for the following components:      Result Value   Specific Gravity, Urine >1.030 (*)    Leukocytes, UA TRACE (*)    All other components within normal limits  CBC - Abnormal; Notable for the following components:   WBC 11.4 (*)    All other components within normal limits  BASIC METABOLIC PANEL - Abnormal; Notable for the following components:   Glucose, Bld 101 (*)    BUN 21 (*)    Calcium 8.8 (*)    All other components within normal limits  URINALYSIS, MICROSCOPIC (REFLEX) - Abnormal; Notable for the following components:   Bacteria, UA FEW (*)    All other components within normal limits  URINE CULTURE    EKG None  Radiology Ct Renal Stone Study  Result Date: 06/30/2017 CLINICAL DATA:  Acute right flank pain. EXAM: CT ABDOMEN AND PELVIS WITHOUT CONTRAST TECHNIQUE: Multidetector CT imaging of the abdomen and pelvis was performed following the standard protocol without IV contrast. COMPARISON:  CT scan of January 24, 2005. FINDINGS: Lower chest: No acute abnormality. Hepatobiliary: No focal liver abnormality is seen. No gallstones, gallbladder wall thickening, or biliary dilatation. Pancreas: Unremarkable. No pancreatic ductal dilatation or surrounding inflammatory changes. Spleen: Normal in size without focal abnormality. Adrenals/Urinary Tract: Adrenal glands are unremarkable. Kidneys are normal, without renal calculi, focal lesion, or hydronephrosis. Bladder is unremarkable. Stomach/Bowel: Stomach is within normal limits. Appendix appears normal. No evidence of bowel wall thickening, distention, or inflammatory changes. Vascular/Lymphatic: No significant vascular findings are present. No enlarged abdominal or pelvic lymph nodes. Reproductive: Prostate is unremarkable. Other: Mild fat containing left inguinal  hernia is noted. No abnormal fluid collection is noted. Musculoskeletal: No acute or significant osseous findings. IMPRESSION: No renal or ureteral calculi are noted. No hydronephrosis or renal obstruction is noted. Mild fat containing left inguinal hernia. No other abnormality seen in the abdomen or pelvis. Electronically Signed   By: Lupita Raider, M.D.   On: 06/30/2017 08:21    Procedures Procedures (including critical care time)  Medications Ordered in ED Medications  ciprofloxacin (CIPRO) tablet 500 mg (has no administration in time range)  ketorolac (TORADOL) 30 MG/ML injection 30 mg (30 mg Intravenous Given 06/30/17 0740)  ondansetron (ZOFRAN) injection 4 mg (4 mg Intravenous Given 06/30/17 0740)     Initial Impression / Assessment and Plan / ED Course  I have reviewed the triage vital signs and the nursing notes.  Pertinent labs & imaging results that were available during my care of the patient were reviewed by  me and considered in my medical decision making (see chart for details).     Patient is a 37 year old male with past medical history significant for ADHD and bipolar presenting with right back pain.  Patient reports is been building a shed so he thought that his back pain was secondary to that.  Noticed that it is been on for couple days.  But got worse this morning.  This morning felt more like a hot knife stabbing his right back.  Brought him here to the emergency department.  No nausea no vomiting.  Patient's ex-wife had kidney stone so he is worried that it may be kidney stones.  It is worse with movement.  Has noticed no urinary difficulties.   7:59 AM Differential includes back spasm versus stone.  Will get CT Noncon.  8:50 AM CT shows no stone.  Awaiting urine to ensure there is no infection.  However highly suspect muscle spasm as a cause of patient's pain.  Patient urine equivical.  Will send for culture.  Treat for pyelo and muscle spasm, encourage follow up  with PCP.  Discussed diagnostic uncertainty.   Final Clinical Impressions(s) / ED Diagnoses   Final diagnoses:  Right flank pain    ED Discharge Orders        Ordered    ciprofloxacin (CIPRO) 500 MG tablet  2 times daily     06/30/17 0926    cyclobenzaprine (FLEXERIL) 10 MG tablet  2 times daily PRN     06/30/17 0926    traMADol (ULTRAM) 50 MG tablet  Every 6 hours PRN     06/30/17 0926       Abelino Derrick, MD 06/30/17 [phone]

## 2017-06-30 NOTE — ED Notes (Signed)
NAD at this time. Pt is stable and going home.  

## 2017-06-30 NOTE — Discharge Instructions (Addendum)
Is unclear whether the pain in your right side is coming from an infection in your urine or potential just a back spasm.  We will give you medication to treat both.  Please follow-up with your primary care physician.  Please return with any change in your symptoms.

## 2017-06-30 NOTE — ED Triage Notes (Signed)
Patient states that he woke up in the middle of the night with right sided flank pain and spasm to his back

## 2017-06-30 NOTE — ED Notes (Signed)
Patient transported to CT 

## 2017-07-01 LAB — URINE CULTURE: Culture: NO GROWTH

## 2018-02-06 ENCOUNTER — Encounter (HOSPITAL_BASED_OUTPATIENT_CLINIC_OR_DEPARTMENT_OTHER): Payer: Self-pay | Admitting: Emergency Medicine

## 2018-02-06 ENCOUNTER — Other Ambulatory Visit: Payer: Self-pay

## 2018-02-06 ENCOUNTER — Emergency Department (HOSPITAL_BASED_OUTPATIENT_CLINIC_OR_DEPARTMENT_OTHER)
Admission: EM | Admit: 2018-02-06 | Discharge: 2018-02-06 | Disposition: A | Discharge status: Home / Self Care | Payer: PRIVATE HEALTH INSURANCE | Attending: Emergency Medicine | Admitting: Emergency Medicine

## 2018-02-06 DIAGNOSIS — H209 Unspecified iridocyclitis: Secondary | ICD-10-CM | POA: Diagnosis not present

## 2018-02-06 DIAGNOSIS — Z79899 Other long term (current) drug therapy: Secondary | ICD-10-CM | POA: Diagnosis not present

## 2018-02-06 DIAGNOSIS — H5789 Other specified disorders of eye and adnexa: Secondary | ICD-10-CM | POA: Diagnosis present

## 2018-02-06 DIAGNOSIS — F17210 Nicotine dependence, cigarettes, uncomplicated: Secondary | ICD-10-CM | POA: Insufficient documentation

## 2018-02-06 MED ORDER — TETRACAINE HCL 0.5 % OP SOLN
2.0000 [drp] | Freq: Once | OPHTHALMIC | Status: AC
  Administered 2018-02-06: 2 [drp] via OPHTHALMIC

## 2018-02-06 MED ORDER — TETRACAINE HCL 0.5 % OP SOLN
OPHTHALMIC | Status: AC
  Filled 2018-02-06: qty 4

## 2018-02-06 MED ORDER — FLUORESCEIN SODIUM 1 MG OP STRP
ORAL_STRIP | OPHTHALMIC | Status: AC
  Filled 2018-02-06: qty 1

## 2018-02-06 MED ORDER — FLUORESCEIN SODIUM 1 MG OP STRP
1.0000 | ORAL_STRIP | Freq: Once | OPHTHALMIC | Status: AC
  Administered 2018-02-06: 1 via OPHTHALMIC

## 2018-02-06 NOTE — ED Provider Notes (Signed)
[facility] EMERGENCY DEPARTMENT Provider Note   CSN: [id number] Arrival date & time: 02/06/18  [id number]     History   Chief Complaint Chief Complaint  Patient presents with  . Eye Problem    HPI John Pierce is a 37 y.o. male.  The history is provided by the patient.  Eye Problem   This is a new problem. The current episode started yesterday. The problem occurs constantly. The problem has been rapidly worsening. There is a problem in the left (but right eye is a little red) eye. There was no injury mechanism. The pain is at a severity of 6/10. The pain is moderate. There is no history of trauma to the eye. He does not wear contacts. Associated symptoms include blurred vision, photophobia and eye redness. Pertinent negatives include no discharge and no double vision. He has tried commercial eye wash and water for the symptoms. The treatment provided no relief.    Past Medical History:  Diagnosis Date  . ADHD (attention deficit hyperactivity disorder)   . Bipolar 1 disorder (HCC)   . Cellulitis   . Diverticulitis   . GAD (generalized anxiety disorder)     There are no active problems to display for this patient.   History reviewed. No pertinent surgical history.      Home Medications    Prior to Admission medications   Medication Sig Start Date End Date Taking? Authorizing Provider  ALPRAZolam (XANAX XR) 0.5 MG 24 hr tablet Take 0.5 mg 3 (three) times daily by mouth.     [provider]  amphetamine-dextroamphetamine (ADDERALL XR) 30 MG 24 hr capsule Take 30 mg 2 (two) times daily by mouth. 1 tab in AM, 1 tab at noon, and half tab afternoon    [provider]  ciprofloxacin (CIPRO) 500 MG tablet Take 1 tablet (500 mg total) by mouth 2 (two) times daily. 06/30/17   Mackuen, Courteney Lyn, MD  cyclobenzaprine (FLEXERIL) 10 MG tablet Take 1 tablet (10 mg total) by mouth 2 (two) times daily as needed for muscle spasms. 06/30/17   Mackuen,  Courteney Lyn, MD  ibuprofen (ADVIL,MOTRIN) 400 MG tablet Take 400 mg every 6 (six) hours as needed by mouth.    [provider]  traMADol (ULTRAM) 50 MG tablet Take 1 tablet (50 mg total) every 6 (six) hours as needed by mouth. 12/25/16   Pollina, Canary Brimhristopher J, MD    Family History No family history on file.  Social History Social History   Tobacco Use  . Smoking status: Current Every Day Smoker    Packs/day: 1.00    Years: 15.00    Pack years: 15.00    Types: Cigarettes  . Smokeless tobacco: Never Used  Substance Use Topics  . Alcohol use: Yes    Comment: occasional   . Drug use: No     Allergies   Keflex [cephalexin]   Review of Systems Review of Systems  Eyes: Positive for blurred vision, photophobia and redness. Negative for double vision and discharge.  All other systems reviewed and are negative.    Physical Exam Updated Vital Signs BP (!) 146/76 (BP Location: Left Arm)   Pulse 92   Temp 98 F (36.7 C) (Oral)   Resp 18   Ht 5\' 8"  (1.727 m)   Wt 79.4 kg   SpO2 100%   BMI 26.61 kg/m   Physical Exam Vitals signs and nursing note reviewed.  Constitutional:      General: He is not  in acute distress.    Appearance: Normal appearance.  HENT:     Head: Normocephalic.     Right Ear: Tympanic membrane normal.     Left Ear: Tympanic membrane normal.     Nose: No congestion.     Mouth/Throat:     Mouth: Mucous membranes are moist.  Eyes:     General: Lids are normal.     Intraocular pressure: Left eye pressure is 19 mmHg. Measurements were taken using a handheld tonometer.    Extraocular Movements: Extraocular movements intact.     Conjunctiva/sclera:     Left eye: Left conjunctiva is injected. Chemosis present. No exudate or hemorrhage.    Pupils: Pupils are equal, round, and reactive to light.     Left eye: No corneal abrasion or fluorescein uptake.     Slit lamp exam:    Left eye: Corneal flare and photophobia present.     Comments:  Consensual photophobia.  Visual acuity is 20/30 bilaterally and in each eye  Cardiovascular:     Rate and Rhythm: Normal rate.  Pulmonary:     Effort: Pulmonary effort is normal.  Skin:    General: Skin is warm.     Capillary Refill: Capillary refill takes less than 2 seconds.  Neurological:     General: No focal deficit present.     Mental Status: He is alert.      ED Treatments / Results  Labs (all labs ordered are listed, but only abnormal results are displayed) Labs Reviewed - No data to display  EKG None  Radiology No results found.  Procedures Procedures (including critical care time)  Medications Ordered in ED Medications  fluorescein 1 MG ophthalmic strip (has no administration in time range)  tetracaine (PONTOCAINE) 0.5 % ophthalmic solution (has no administration in time range)  tetracaine (PONTOCAINE) 0.5 % ophthalmic solution 2 drop (2 drops Left Eye Given 02/06/18 [id number])  fluorescein ophthalmic strip 1 strip (1 strip Left Eye Given 02/06/18 [id number])     Initial Impression / Assessment and Plan / ED Course  I have reviewed the triage vital signs and the nursing notes.  Pertinent labs & imaging results that were available during my care of the patient were reviewed by me and considered in my medical decision making (see chart for details).     Patient is a healthy 37 year old male with a history of ADHD presenting today with worsening left eye pain and redness.  Patient states that started yesterday by feeling like there was something in it but he cannot recall any trauma and had nothing flying into his eye.  However throughout the night the pain continued and this morning when he woke up it was extremely red he states entire eyeball feels sore and he is sensitive to the light.  He does complain of some minimal blurry vision but feels like his vision is mostly the same.  He does not use contacts or regular eyedrops.  He did have URI and sinus type symptoms 3 weeks  ago but had been well for over a week.  His daughter did have pinkeye but had been treated and looked better by the time he saw her which was 3 to 4 days after she had been diagnosed.  He noticed that his right eye looks slightly red but he has no pain in his right eye.  On exam he has no ulcerations, foreign bodies or corneal abrasions.  He does have consensual photophobia and concerns for uveitis.  Pressure  is within normal limits and pupils are reactive.  There is no discharge from the eye and only mild tearing.  Will discuss with ophthalmology Spoke with Dr. Margaretmary Eddy assistant and they will see him in the office.  Instructed him to go straight there. Final Clinical Impressions(s) / ED Diagnoses   Final diagnoses:  Uveitis    ED Discharge Orders    None       Gwyneth Sprout, MD 02/06/18 [phone]

## 2018-02-06 NOTE — ED Triage Notes (Signed)
Left eye irritation started yesterday while working in [employer].  He rinsed it out but it still felt irritated.  This morning it is worse.  Also some "crusting" this morning in the left eye.  Daughter had pink eye last week so he does not know if it is an injury from yesterday or conjunctivitis.

## 2018-02-06 NOTE — ED Notes (Signed)
ED Provider at bedside. 

## 2018-11-08 ENCOUNTER — Emergency Department (HOSPITAL_BASED_OUTPATIENT_CLINIC_OR_DEPARTMENT_OTHER): Payer: PRIVATE HEALTH INSURANCE

## 2018-11-08 ENCOUNTER — Encounter (HOSPITAL_BASED_OUTPATIENT_CLINIC_OR_DEPARTMENT_OTHER): Payer: Self-pay | Admitting: *Deleted

## 2018-11-08 ENCOUNTER — Other Ambulatory Visit: Payer: Self-pay

## 2018-11-08 ENCOUNTER — Emergency Department (HOSPITAL_BASED_OUTPATIENT_CLINIC_OR_DEPARTMENT_OTHER)
Admission: EM | Admit: 2018-11-08 | Discharge: 2018-11-08 | Disposition: A | Discharge status: Home / Self Care | Payer: PRIVATE HEALTH INSURANCE | Attending: Emergency Medicine | Admitting: Emergency Medicine

## 2018-11-08 DIAGNOSIS — S61511A Laceration without foreign body of right wrist, initial encounter: Secondary | ICD-10-CM | POA: Diagnosis present

## 2018-11-08 DIAGNOSIS — Y929 Unspecified place or not applicable: Secondary | ICD-10-CM | POA: Insufficient documentation

## 2018-11-08 DIAGNOSIS — W208XXA Other cause of strike by thrown, projected or falling object, initial encounter: Secondary | ICD-10-CM | POA: Diagnosis not present

## 2018-11-08 DIAGNOSIS — Y9389 Activity, other specified: Secondary | ICD-10-CM | POA: Insufficient documentation

## 2018-11-08 DIAGNOSIS — F17210 Nicotine dependence, cigarettes, uncomplicated: Secondary | ICD-10-CM | POA: Insufficient documentation

## 2018-11-08 DIAGNOSIS — Y999 Unspecified external cause status: Secondary | ICD-10-CM | POA: Diagnosis not present

## 2018-11-08 DIAGNOSIS — Z79899 Other long term (current) drug therapy: Secondary | ICD-10-CM | POA: Diagnosis not present

## 2018-11-08 DIAGNOSIS — Z23 Encounter for immunization: Secondary | ICD-10-CM | POA: Diagnosis not present

## 2018-11-08 DIAGNOSIS — S61411A Laceration without foreign body of right hand, initial encounter: Secondary | ICD-10-CM

## 2018-11-08 MED ORDER — ONDANSETRON 4 MG PO TBDP
4.0000 mg | ORAL_TABLET | Freq: Once | ORAL | Status: AC
  Administered 2018-11-08: 4 mg via ORAL
  Filled 2018-11-08: qty 1

## 2018-11-08 MED ORDER — ONDANSETRON HCL 4 MG PO TABS: 4.0000 mg | ORAL_TABLET | Freq: Four times a day (QID) | ORAL | 0 refills | Status: AC

## 2018-11-08 MED ORDER — OXYCODONE-ACETAMINOPHEN 5-325 MG PO TABS
1.0000 | ORAL_TABLET | Freq: Once | ORAL | Status: AC
  Administered 2018-11-08: 21:00:00 1 via ORAL
  Filled 2018-11-08: qty 1

## 2018-11-08 MED ORDER — LIDOCAINE HCL (PF) 1 % IJ SOLN
2.0000 mL | Freq: Once | INTRAMUSCULAR | Status: DC
  Filled 2018-11-08: qty 5

## 2018-11-08 MED ORDER — TETANUS-DIPHTH-ACELL PERTUSSIS 5-2.5-18.5 LF-MCG/0.5 IM SUSP
0.5000 mL | Freq: Once | INTRAMUSCULAR | Status: AC
  Administered 2018-11-08: 21:00:00 0.5 mL via INTRAMUSCULAR
  Filled 2018-11-08: qty 0.5

## 2018-11-08 MED ORDER — OXYCODONE-ACETAMINOPHEN 5-325 MG PO TABS: 1.0000 | ORAL_TABLET | ORAL | 0 refills | Status: AC | PRN

## 2018-11-08 NOTE — ED Provider Notes (Signed)
[facility] DEPART[facility] Provider Note   CSN: [id number] Arrival date & time: 11/08/18  1950     History   Chief Complaint Chief Complaint  Patient presents with  . Laceration    HPI John Pierce is a 38 y.o. male.     38 y.o male with a PMH of ADHD, Diverticulitis, GAD presents to the ED with a chief complaint of right hand laceration. He reports instilling a brand-new stove pipe onto his chimney when he had the stovepipe fall, reports border is serrated, this struck the medial aspect of his right wrist.  He reports significant bleeding occurred at the time, states he now feels numbness to his right thumb states is very painful to palpation.  States there is a tingling sensation radiating from the wound onto his elbow.  Patient washed the wound extensively, he then applied Neosporin along with a bandage.  He is unsure when his last tetanus vaccine was obtained.  Able to flex and extend hand with some diminishing strength on his right thumb.  Denies any other injury.  The history is provided by the patient.  Laceration Associated symptoms: no fever     Past Medical History:  Diagnosis Date  . ADHD (attention deficit hyperactivity disorder)   . Bipolar 1 disorder ([place])   . Cellulitis   . Diverticulitis   . GAD (generalized anxiety disorder)     There are no active problems to display for this patient.   History reviewed. No pertinent surgical history.      Home Medications    Prior to Admission medications   Medication Sig Start Date End Date Taking? Authorizing Provider  amphetamine-dextroamphetamine (ADDERALL XR) 30 MG 24 hr capsule Take 30 mg 2 (two) times daily by mouth. 1 tab in AM, 1 tab at noon, and half tab afternoon   Yes [provider]  ALPRAZolam (XANAX XR) 0.5 MG 24 hr tablet Take 0.5 mg 3 (three) times daily by mouth.     [provider]  oxyCODONE-acetaminophen (PERCOCET/ROXICET) 5-325 MG tablet Take 1 tablet by  mouth every 4 (four) hours as needed for up to 3 days for severe pain. 11/08/18 11/11/18  Janeece Fitting, PA-C  sildenafil (REVATIO) 20 MG tablet TAKE AS DIRECTED 1 HOUR PRIOR TO SEXUAL ACTIVITY MUST BE SEEN FOR REFILLS 01/19/18   [provider]    Family History No family history on file.  Social History Social History   Tobacco Use  . Smoking status: Current Every Day Smoker    Packs/day: 1.00    Years: 15.00    Pack years: 15.00    Types: Cigarettes  . Smokeless tobacco: Never Used  Substance Use Topics  . Alcohol use: Yes    Comment: occasional   . Drug use: No     Allergies   Keflex [cephalexin]   Review of Systems Review of Systems  Constitutional: Negative for fever.  Musculoskeletal: Negative for myalgias.  Skin: Positive for wound.  Neurological: Negative for dizziness.     Physical Exam Updated Vital Signs BP 140/81 (BP Location: Left Arm)   Pulse 84   Temp 98.5 F (36.9 C) (Oral)   Resp 16   Ht 5\' 7"  (1.702 m)   Wt 79.4 kg   SpO2 98%   BMI 27.41 kg/m   Physical Exam Vitals signs and nursing note reviewed.  Constitutional:      Appearance: Normal appearance. He is well-developed.  HENT:     Head: Normocephalic and atraumatic.  Eyes:     General: No scleral icterus.    Pupils: Pupils are equal, round, and reactive to light.  Neck:     Musculoskeletal: Normal range of motion.  Cardiovascular:     Heart sounds: Normal heart sounds.  Pulmonary:     Effort: Pulmonary effort is normal.     Breath sounds: Normal breath sounds. No wheezing.  Chest:     Chest wall: No tenderness.  Abdominal:     General: Bowel sounds are normal. There is no distension.     Palpations: Abdomen is soft.     Tenderness: There is no abdominal tenderness.  Musculoskeletal:        General: No deformity.     Right hand: He exhibits decreased range of motion, tenderness, laceration and swelling. He exhibits normal capillary refill and no deformity. Decreased  sensation noted. Decreased sensation is present in the radial distribution. Decreased strength noted. He exhibits thumb/finger opposition. He exhibits no finger abduction.       Hands:  Skin:    General: Skin is warm and dry.  Neurological:     Mental Status: He is alert and oriented to person, place, and time.      ED Treatments / Results  Labs (all labs ordered are listed, but only abnormal results are displayed) Labs Reviewed - No data to display  EKG None  Radiology Dg Hand 2 View Right  Result Date: 11/08/2018 CLINICAL DATA:  Laceration to right hand about the proximal first metacarpal/wrist. EXAM: RIGHT HAND - 2 VIEW COMPARISON:  Radiograph 04/16/2008 FINDINGS: There is no evidence of fracture or dislocation. There is no evidence of arthropathy or other focal bone abnormality. Soft tissue thickening and skin irregularity about the radial aspect of the proximal carpal row. No tracking soft tissue air or radiopaque foreign body. IMPRESSION: Soft tissue thickening and skin irregularity about the radial aspect of the proximal carpal row consistent with laceration. No radiopaque foreign body or osseous abnormality. Electronically Signed   By: Narda RutherfordMelanie  Sanford M.D.   On: 11/08/2018 21:10    Procedures .Marland Kitchen.Laceration Repair  Date/Time: 11/08/2018 9:52 PM Performed by: Claude MangesSoto, Yannet Rincon, PA-C Authorized by: Claude MangesSoto, Nirali Magouirk, PA-C   Consent:    Consent obtained:  Verbal   Consent given by:  Patient   Risks discussed:  Infection, pain, poor cosmetic result, need for additional repair, nerve damage and tendon damage Anesthesia (see MAR for exact dosages):    Anesthesia method:  None Laceration details:    Location:  Hand   Hand location:  R wrist   Length (cm):  1   Depth (mm):  0.5 Repair type:    Repair type:  Simple Exploration:    Hemostasis achieved with:  Direct pressure Treatment:    Area cleansed with:  Hibiclens and saline Skin repair:    Repair method:  Sutures   Suture  size:  4-0   Suture technique:  Simple interrupted   Number of sutures:  2 Approximation:    Approximation:  Close Post-procedure details:    Dressing:  Splint for protection and antibiotic ointment   Patient tolerance of procedure:  Tolerated well, no immediate complications   (including critical care time)  Medications Ordered in ED Medications  lidocaine (PF) (XYLOCAINE) 1 % injection 2 mL (has no administration in time range)  oxyCODONE-acetaminophen (PERCOCET/ROXICET) 5-325 MG per tablet 1 tablet (1 tablet Oral Given 11/08/18 2053)  Tdap (BOOSTRIX) injection 0.5 mL (0.5 mLs Intramuscular Given 11/08/18 2048)  ondansetron (ZOFRAN-ODT) disintegrating  tablet 4 mg (4 mg Oral Given 11/08/18 2052)     Initial Impression / Assessment and Plan / ED Course  I have reviewed the triage vital signs and the nursing notes.  Pertinent labs & imaging results that were available during my care of the patient were reviewed by me and considered in my medical decision making (see chart for details).       Patient with no pertinent past medical history presents to the ED with right hand laceration, x1 hour prior to arrival.  Patient reports he was turning a brand-new stove when the serrated portion of it fell on his hand, there is significant bleeding at the time it occurred.  There is a 1 cm moon shaped laceration to the medial aspect of his right hand.  There is also some swelling noted to the area, he reports numbness sensation to his right thumb, this is shooting onto his right elbow.  During my exam his pulses are present and symmetric, capillary refill is intact.  There is decreased range of motion with thumb opposition.  Diminished sensation to the thumb region, does have full sensation to his fingertip.  Will obtain x-ray, provide patient with pain medication with Zofran to help for his nausea, suspect will likely need to consult hand specialist for further recommendations.  DG Hand showed:  Soft tissue thickening and skin irregularity about the radial aspect  of the proximal carpal row consistent with laceration. No radiopaque  foreign body or osseous abnormality.      9:31 PM spoke to Dr. Aundria Rud hand specialist who recommended patient be closed at the skin, he will follow-up with him in office.  He also recommended a thumb spica.       Portions of this note were generated with Scientist, clinical (histocompatibility and immunogenetics). Dictation errors may occur despite best attempts at proofreading.  Final Clinical Impressions(s) / ED Diagnoses   Final diagnoses:  Laceration of right hand without foreign body, initial encounter    ED Discharge Orders         Ordered    oxyCODONE-acetaminophen (PERCOCET/ROXICET) 5-325 MG tablet  Every 4 hours PRN     11/08/18 2131           Claude Manges, PA-C 11/08/18 2153    Pricilla Loveless, MD 11/08/18 2317

## 2018-11-08 NOTE — Discharge Instructions (Signed)
2.  Dr. Stann Mainland hand specialist attached to your chart, please call tomorrow morning in order to schedule an appointment for him to see you at the end of the week.  Please keep your thumb on the splint, not remove the splint until you see the orthopedist.

## 2018-11-08 NOTE — ED Triage Notes (Signed)
Pt reports cut his right hand on a stove pipe at 430 today. States that his thumb feels numb. Able to bend thumb. Dressing not removed in triage

## 2019-01-16 ENCOUNTER — Other Ambulatory Visit: Payer: Self-pay

## 2019-01-16 ENCOUNTER — Inpatient Hospital Stay (HOSPITAL_COMMUNITY)
Admission: EM | Admit: 2019-01-16 | Discharge: 2019-01-17 | DRG: 494 | Disposition: A | Discharge status: Home / Self Care | Payer: PRIVATE HEALTH INSURANCE | Attending: Orthopedic Surgery | Admitting: Orthopedic Surgery

## 2019-01-16 ENCOUNTER — Emergency Department (HOSPITAL_COMMUNITY): Payer: PRIVATE HEALTH INSURANCE | Admitting: Certified Registered"

## 2019-01-16 ENCOUNTER — Emergency Department (HOSPITAL_COMMUNITY): Payer: PRIVATE HEALTH INSURANCE

## 2019-01-16 ENCOUNTER — Encounter (HOSPITAL_COMMUNITY): Payer: Self-pay | Admitting: Pharmacy Technician

## 2019-01-16 ENCOUNTER — Encounter (HOSPITAL_COMMUNITY)
Admission: EM | Disposition: A | Discharge status: Home / Self Care | Payer: Self-pay | Source: Home / Self Care | Attending: Orthopedic Surgery

## 2019-01-16 DIAGNOSIS — S82292C Other fracture of shaft of left tibia, initial encounter for open fracture type IIIA, IIIB, or IIIC: Secondary | ICD-10-CM

## 2019-01-16 DIAGNOSIS — F411 Generalized anxiety disorder: Secondary | ICD-10-CM | POA: Diagnosis present

## 2019-01-16 DIAGNOSIS — Z20828 Contact with and (suspected) exposure to other viral communicable diseases: Secondary | ICD-10-CM | POA: Diagnosis present

## 2019-01-16 DIAGNOSIS — Z79899 Other long term (current) drug therapy: Secondary | ICD-10-CM | POA: Diagnosis not present

## 2019-01-16 DIAGNOSIS — Z881 Allergy status to other antibiotic agents status: Secondary | ICD-10-CM

## 2019-01-16 DIAGNOSIS — M79605 Pain in left leg: Secondary | ICD-10-CM | POA: Diagnosis present

## 2019-01-16 DIAGNOSIS — F909 Attention-deficit hyperactivity disorder, unspecified type: Secondary | ICD-10-CM | POA: Diagnosis present

## 2019-01-16 DIAGNOSIS — W293XXA Contact with powered garden and outdoor hand tools and machinery, initial encounter: Secondary | ICD-10-CM | POA: Diagnosis not present

## 2019-01-16 DIAGNOSIS — F319 Bipolar disorder, unspecified: Secondary | ICD-10-CM | POA: Diagnosis present

## 2019-01-16 DIAGNOSIS — F17210 Nicotine dependence, cigarettes, uncomplicated: Secondary | ICD-10-CM | POA: Diagnosis present

## 2019-01-16 DIAGNOSIS — S82202B Unspecified fracture of shaft of left tibia, initial encounter for open fracture type I or II: Secondary | ICD-10-CM | POA: Diagnosis present

## 2019-01-16 DIAGNOSIS — Y92009 Unspecified place in unspecified non-institutional (private) residence as the place of occurrence of the external cause: Secondary | ICD-10-CM | POA: Diagnosis not present

## 2019-01-16 DIAGNOSIS — S81812A Laceration without foreign body, left lower leg, initial encounter: Secondary | ICD-10-CM | POA: Diagnosis present

## 2019-01-16 HISTORY — PX: I & D EXTREMITY: SHX5045

## 2019-01-16 LAB — SARS CORONAVIRUS 2 BY RT PCR (HOSPITAL ORDER, PERFORMED IN ~~LOC~~ HOSPITAL LAB): SARS Coronavirus 2: NEGATIVE

## 2019-01-16 LAB — BASIC METABOLIC PANEL
Anion gap: 13 (ref 5–15)
BUN: 20 mg/dL (ref 6–20)
CO2: 23 mmol/L (ref 22–32)
Calcium: 9.4 mg/dL (ref 8.9–10.3)
Chloride: 105 mmol/L (ref 98–111)
Creatinine, Ser: 1.35 mg/dL — ABNORMAL HIGH (ref 0.61–1.24)
GFR calc Af Amer: >60 mL/min (ref >60)
GFR calc non Af Amer: >60 mL/min (ref >60)
Glucose, Bld: 96 mg/dL (ref 70–99)
Potassium: 3.6 mmol/L (ref 3.5–5.1)
Sodium: 141 mmol/L (ref 135–145)

## 2019-01-16 LAB — CBC WITH DIFFERENTIAL/PLATELET
Abs Immature Granulocytes: 0.06 10*3/uL (ref 0.00–0.07)
Basophils Absolute: 0.1 10*3/uL (ref 0.0–0.1)
Basophils Relative: 0 %
Eosinophils Absolute: 0 10*3/uL (ref 0.0–0.5)
Eosinophils Relative: 0 %
HCT: 42.3 % (ref 39.0–52.0)
Hemoglobin: 13.8 g/dL (ref 13.0–17.0)
Immature Granulocytes: 0 %
Lymphocytes Relative: 12 %
Lymphs Abs: 1.9 10*3/uL (ref 0.7–4.0)
MCH: 29.2 pg (ref 26.0–34.0)
MCHC: 32.6 g/dL (ref 30.0–36.0)
MCV: 89.4 fL (ref 80.0–100.0)
Monocytes Absolute: 0.9 10*3/uL (ref 0.1–1.0)
Monocytes Relative: 6 %
Neutro Abs: 12.9 10*3/uL — ABNORMAL HIGH (ref 1.7–7.7)
Neutrophils Relative %: 82 %
Platelets: 352 10*3/uL (ref 150–400)
RBC: 4.73 MIL/uL (ref 4.22–5.81)
RDW: 13.4 % (ref 11.5–15.5)
WBC: 15.8 10*3/uL — ABNORMAL HIGH (ref 4.0–10.5)
nRBC: 0 % (ref 0.0–0.2)

## 2019-01-16 SURGERY — IRRIGATION AND DEBRIDEMENT EXTREMITY
Anesthesia: General | Site: Leg Lower | Laterality: Left

## 2019-01-16 MED ORDER — ONDANSETRON HCL 4 MG/2ML IJ SOLN
4.0000 mg | Freq: Once | INTRAMUSCULAR | Status: AC
  Administered 2019-01-16: 4 mg via INTRAVENOUS
  Filled 2019-01-16: qty 2

## 2019-01-16 MED ORDER — PROMETHAZINE HCL 25 MG/ML IJ SOLN: 6.2500 mg | INTRAMUSCULAR | Status: DC | PRN

## 2019-01-16 MED ORDER — SODIUM CHLORIDE (PF) 0.9 % IJ SOLN
INTRAMUSCULAR | Status: AC
  Filled 2019-01-16: qty 10

## 2019-01-16 MED ORDER — LACTATED RINGERS IV SOLN: INTRAVENOUS | Status: DC

## 2019-01-16 MED ORDER — HYDROMORPHONE HCL 1 MG/ML IJ SOLN
2.0000 mg | Freq: Once | INTRAMUSCULAR | Status: AC
  Administered 2019-01-16: 19:00:00 2 mg via INTRAVENOUS
  Filled 2019-01-16: qty 2

## 2019-01-16 MED ORDER — ACETAMINOPHEN 10 MG/ML IV SOLN
1000.0000 mg | Freq: Once | INTRAVENOUS | Status: DC | PRN
  Administered 2019-01-17: 1000 mg via INTRAVENOUS

## 2019-01-16 MED ORDER — VANCOMYCIN HCL IN DEXTROSE 1-5 GM/200ML-% IV SOLN
1000.0000 mg | Freq: Once | INTRAVENOUS | Status: DC
  Filled 2019-01-16: qty 200

## 2019-01-16 MED ORDER — ONDANSETRON HCL 4 MG/2ML IJ SOLN
INTRAMUSCULAR | Status: AC
  Filled 2019-01-16: qty 4

## 2019-01-16 MED ORDER — DEXAMETHASONE SODIUM PHOSPHATE 10 MG/ML IJ SOLN
INTRAMUSCULAR | Status: AC
  Filled 2019-01-16: qty 1

## 2019-01-16 MED ORDER — LIDOCAINE 2% (20 MG/ML) 5 ML SYRINGE
INTRAMUSCULAR | Status: DC | PRN
  Administered 2019-01-16: 100 mg via INTRAVENOUS

## 2019-01-16 MED ORDER — ONDANSETRON HCL 4 MG/2ML IJ SOLN
INTRAMUSCULAR | Status: DC | PRN
  Administered 2019-01-16 (×2): 4 mg via INTRAVENOUS

## 2019-01-16 MED ORDER — ACETAMINOPHEN 325 MG PO TABS: 325.0000 mg | ORAL_TABLET | Freq: Once | ORAL | Status: DC | PRN

## 2019-01-16 MED ORDER — GENTAMICIN SULFATE 40 MG/ML IJ SOLN: 1.5000 mg/kg | Freq: Once | INTRAVENOUS | Status: DC

## 2019-01-16 MED ORDER — PROPOFOL 10 MG/ML IV BOLUS
INTRAVENOUS | Status: AC
  Filled 2019-01-16: qty 20

## 2019-01-16 MED ORDER — CLINDAMYCIN PHOSPHATE 900 MG/50ML IV SOLN
900.0000 mg | Freq: Once | INTRAVENOUS | Status: AC
  Administered 2019-01-16: 19:00:00 900 mg via INTRAVENOUS
  Filled 2019-01-16: qty 50

## 2019-01-16 MED ORDER — HYDROMORPHONE HCL 1 MG/ML IJ SOLN
2.0000 mg | Freq: Once | INTRAMUSCULAR | Status: AC
  Administered 2019-01-16: 20:00:00 2 mg via INTRAVENOUS
  Filled 2019-01-16: qty 2

## 2019-01-16 MED ORDER — PROPOFOL 10 MG/ML IV BOLUS
INTRAVENOUS | Status: DC | PRN
  Administered 2019-01-16: 130 mg via INTRAVENOUS

## 2019-01-16 MED ORDER — MIDAZOLAM HCL 2 MG/2ML IJ SOLN
INTRAMUSCULAR | Status: DC | PRN
  Administered 2019-01-16: 2 mg via INTRAVENOUS

## 2019-01-16 MED ORDER — ONDANSETRON HCL 4 MG/2ML IJ SOLN
4.0000 mg | Freq: Once | INTRAMUSCULAR | Status: AC
  Administered 2019-01-16: 4 mg via INTRAVENOUS

## 2019-01-16 MED ORDER — SUFENTANIL CITRATE 50 MCG/ML IV SOLN
INTRAVENOUS | Status: AC
  Filled 2019-01-16: qty 1

## 2019-01-16 MED ORDER — LACTATED RINGERS IV SOLN
INTRAVENOUS | Status: DC | PRN
  Administered 2019-01-16 (×2): via INTRAVENOUS

## 2019-01-16 MED ORDER — CLINDAMYCIN PHOSPHATE 600 MG/50ML IV SOLN: 600.0000 mg | Freq: Once | INTRAVENOUS | Status: DC

## 2019-01-16 MED ORDER — SUFENTANIL CITRATE 50 MCG/ML IV SOLN
INTRAVENOUS | Status: DC | PRN
  Administered 2019-01-16 (×2): 10 ug via INTRAVENOUS

## 2019-01-16 MED ORDER — SUCCINYLCHOLINE CHLORIDE 200 MG/10ML IV SOSY
PREFILLED_SYRINGE | INTRAVENOUS | Status: AC
  Filled 2019-01-16: qty 10

## 2019-01-16 MED ORDER — MEPERIDINE HCL 25 MG/ML IJ SOLN: 6.2500 mg | INTRAMUSCULAR | Status: DC | PRN

## 2019-01-16 MED ORDER — MIDAZOLAM HCL 2 MG/2ML IJ SOLN
INTRAMUSCULAR | Status: AC
  Filled 2019-01-16: qty 2

## 2019-01-16 MED ORDER — DEXAMETHASONE SODIUM PHOSPHATE 10 MG/ML IJ SOLN
INTRAMUSCULAR | Status: DC | PRN
  Administered 2019-01-16: 10 mg via INTRAVENOUS

## 2019-01-16 MED ORDER — VANCOMYCIN HCL 1000 MG IV SOLR
INTRAVENOUS | Status: DC | PRN
  Administered 2019-01-16: 1000 mg via INTRAVENOUS

## 2019-01-16 MED ORDER — ACETAMINOPHEN 160 MG/5ML PO SOLN: 325.0000 mg | Freq: Once | ORAL | Status: DC | PRN

## 2019-01-16 MED ORDER — SODIUM CHLORIDE 0.9 % IV SOLN
1.0000 g | Freq: Once | INTRAVENOUS | Status: AC
  Administered 2019-01-16: 1 g via INTRAVENOUS
  Filled 2019-01-16: qty 1

## 2019-01-16 MED ORDER — SUCCINYLCHOLINE CHLORIDE 200 MG/10ML IV SOSY
PREFILLED_SYRINGE | INTRAVENOUS | Status: DC | PRN
  Administered 2019-01-16: 140 mg via INTRAVENOUS

## 2019-01-16 MED ORDER — HYDROMORPHONE HCL 1 MG/ML IJ SOLN: 0.2500 mg | INTRAMUSCULAR | Status: DC | PRN

## 2019-01-16 MED ORDER — ONDANSETRON HCL 4 MG/2ML IJ SOLN
INTRAMUSCULAR | Status: AC
  Filled 2019-01-16: qty 2

## 2019-01-16 MED ORDER — LIDOCAINE 2% (20 MG/ML) 5 ML SYRINGE
INTRAMUSCULAR | Status: AC
  Filled 2019-01-16: qty 5

## 2019-01-16 SURGICAL SUPPLY — 31 items
BANDAGE ESMARK 6X9 LF (GAUZE/BANDAGES/DRESSINGS) ×1 IMPLANT
BNDG CMPR 9X6 STRL LF SNTH (GAUZE/BANDAGES/DRESSINGS) ×1
BNDG ELASTIC 4X5.8 VLCR STR LF (GAUZE/BANDAGES/DRESSINGS) ×1 IMPLANT
BNDG ESMARK 6X9 LF (GAUZE/BANDAGES/DRESSINGS) ×2
BNDG GAUZE ELAST 4 BULKY (GAUZE/BANDAGES/DRESSINGS) ×2 IMPLANT
CANISTER WOUNDNEG PRESSURE 500 (CANNISTER) ×1 IMPLANT
COVER SURGICAL LIGHT HANDLE (MISCELLANEOUS) ×2 IMPLANT
COVER WAND RF STERILE (DRAPES) ×2 IMPLANT
CUFF TOURN SGL QUICK 34 (TOURNIQUET CUFF)
CUFF TRNQT CYL 34X4.125X (TOURNIQUET CUFF) IMPLANT
DRESSING PREVENA PLUS CUSTOM (GAUZE/BANDAGES/DRESSINGS) IMPLANT
DRSG PAD ABDOMINAL 8X10 ST (GAUZE/BANDAGES/DRESSINGS) ×2 IMPLANT
DRSG PREVENA PLUS CUSTOM (GAUZE/BANDAGES/DRESSINGS) ×2
GAUZE SPONGE 4X4 12PLY STRL (GAUZE/BANDAGES/DRESSINGS) ×2 IMPLANT
GLOVE BIO SURGEON STRL SZ8.5 (GLOVE) ×1 IMPLANT
GLOVE BIOGEL PI IND STRL 7.5 (GLOVE) ×1 IMPLANT
GLOVE BIOGEL PI INDICATOR 7.5 (GLOVE) ×1
GOWN STRL REUS W/ TWL LRG LVL3 (GOWN DISPOSABLE) ×1 IMPLANT
GOWN STRL REUS W/ TWL XL LVL3 (GOWN DISPOSABLE) ×2 IMPLANT
GOWN STRL REUS W/TWL LRG LVL3 (GOWN DISPOSABLE) ×2
GOWN STRL REUS W/TWL XL LVL3 (GOWN DISPOSABLE) ×4
HANDPIECE INTERPULSE COAX TIP (DISPOSABLE) ×2
KIT BASIN OR (CUSTOM PROCEDURE TRAY) ×2 IMPLANT
MANIFOLD NEPTUNE II (INSTRUMENTS) ×2 IMPLANT
PACK ORTHO EXTREMITY (CUSTOM PROCEDURE TRAY) ×2 IMPLANT
PAD CAST 4YDX4 CTTN HI CHSV (CAST SUPPLIES) ×1 IMPLANT
PADDING CAST COTTON 4X4 STRL (CAST SUPPLIES) ×2
SET HNDPC FAN SPRY TIP SCT (DISPOSABLE) ×1 IMPLANT
SUT ETHILON 2 0 PSLX (SUTURE) ×3 IMPLANT
TOWEL GREEN STERILE (TOWEL DISPOSABLE) ×4 IMPLANT
YANKAUER SUCT BULB TIP NO VENT (SUCTIONS) ×1 IMPLANT

## 2019-01-16 NOTE — Consult Note (Signed)
ORTHOPAEDIC CONSULTATION  REQUESTING PHYSICIAN: Little, Wenda Overland, MD  PCP:  Larene Beach, MD  Chief Complaint: Chainsaw injury left lower extremity  HPI: John Pierce is a 38 y.o. male who complains of chainsaw injury to the left lower extremity earlier tonight.  He complains of subjective sensory change on the medial aspect of the foot.  No other injuries.  X-rays revealed small cortical defect to the tibia without complete fracture.  His tetanus is up-to-date.  He was given IV clindamycin and aztreonam in the emergency department due to cephalosporin allergy.  He smokes 1/2 pack/day.  Past Medical History:  Diagnosis Date  . ADHD (attention deficit hyperactivity disorder)   . Bipolar 1 disorder ([place])   . Cellulitis   . Diverticulitis   . GAD (generalized anxiety disorder)    History reviewed. No pertinent surgical history. Social History   Socioeconomic History  . Marital status: Married    Spouse name: Not on file  . Number of children: Not on file  . Years of education: Not on file  . Highest education level: Not on file  Occupational History  . Not on file  Social Needs  . Financial resource strain: Not on file  . Food insecurity    Worry: Not on file    Inability: Not on file  . Transportation needs    Medical: Not on file    Non-medical: Not on file  Tobacco Use  . Smoking status: Current Every Day Smoker    Packs/day: 1.00    Years: 15.00    Pack years: 15.00    Types: Cigarettes  . Smokeless tobacco: Never Used  Substance and Sexual Activity  . Alcohol use: Yes    Comment: occasional   . Drug use: No  . Sexual activity: Not on file  Lifestyle  . Physical activity    Days per week: Not on file    Minutes per session: Not on file  . Stress: Not on file  Relationships  . Social Herbalist on phone: Not on file    Gets together: Not on file    Attends religious service: Not on file    Active member of club or organization:  Not on file    Attends meetings of clubs or organizations: Not on file    Relationship status: Not on file  Other Topics Concern  . Not on file  Social History Narrative  . Not on file   History reviewed. No pertinent family history. Allergies  Allergen Reactions  . Keflex [Cephalexin] Rash    States he is not completely positive Keflex is the name of the medicine he is allergic to   Prior to Admission medications   Medication Sig Start Date End Date Taking? Authorizing Provider  ALPRAZolam (XANAX XR) 0.5 MG 24 hr tablet Take 0.5 mg by mouth as needed for anxiety (as needed).    Yes [provider]  amphetamine-dextroamphetamine (ADDERALL XR) 30 MG 24 hr capsule Take 30 mg 2 (two) times daily by mouth. 1 tab in AM, 1 tab at noon, and half tab afternoon   Yes [provider]  sildenafil (REVATIO) 20 MG tablet Take 20 mg by mouth See admin instructions. Take 1 hour prior to sexual activity 01/19/18  Yes [provider]   Dg Tibia/fibula Left  Result Date: 01/16/2019 CLINICAL DATA:  Chainsaw injury EXAM: LEFT TIBIA AND FIBULA - 2 VIEW COMPARISON:  None. FINDINGS: There is a large wound extending  3 cm to the underlying lateral tibial cortex where there is cortical irregularity and incomplete fractures of the cortical surface. No complete fracture or displaced fracture fragments are identified. No foreign bodies are seen within the soft tissues. IMPRESSION: 3 cm large wound extending 3 cm extending to the lateral distal tibial cortex where there is incomplete cortical fractures. Electronically Signed   By: Jonna Clark M.D.   On: 01/16/2019 19:33    Positive ROS: All other systems have been reviewed and were otherwise negative with the exception of those mentioned in the HPI and as above.  Physical Exam: General: Alert, no acute distress Cardiovascular: No pedal edema Respiratory: No cyanosis, no use of accessory musculature GI: No organomegaly, abdomen is soft and  non-tender Skin: No lesions in the area of chief complaint Neurologic: Sensation intact distally Psychiatric: Patient is competent for consent with normal mood and affect Lymphatic: No axillary or cervical lymphadenopathy  MUSCULOSKELETAL: Examination of the left lower extremity reveals approximate 10 cm oblique wound at the junction of the middle and distal thirds of the tibia over the anterior medial border.  The laceration goes down to bone.  He has subjective sensory change to the saphenous distribution.  He reports mild subjective sensory change to the superficial peroneal distribution.  He has intact sensation to the deep peroneal, sural, and posterior tibial distributions.  Positive motor function dorsiflexion, plantarflexion, great toe extension with strength limited by pain.  Palpable pedal pulses.  Assessment: Chainsaw injury left lower extremity with incomplete cortical defect of tibia.  Technically, grade 2 open tibia fracture.  Plan: I discussed the findings with the patient.  The injury requires operative debridement and closure.  I will explore the fracture intraoperatively.  We discussed the risk, benefits, and alternatives which include, but are not limited to, the risk of anesthesia (death), bleeding, infection, damage to blood vessels/nerves, delayed wound healing, completion of the fracture, reoperation.  He will likely be able to weight-bear as tolerated with a cam walker boot postop.  Plan to admit him for 24 hours of IV antibiotics and discharge him on a short course of oral antibiotics.  Mobilize out of bed tomorrow.  All questions solicited and answered.    Jonette Pesa, MD [phone]    01/16/2019 10:19 PM

## 2019-01-16 NOTE — Op Note (Signed)
OPERATIVE REPORT   01/16/2019  11:53 PM  PATIENT:  John Pierce   SURGEON:  Jonette Pesa, MD  ASSISTANT:  Dennie Bible, PA-C.   PREOPERATIVE DIAGNOSIS:  Grade 2 open left tibia fracture.  POSTOPERATIVE DIAGNOSIS:  Same.  PROCEDURE:  1.  Excisional debridement of skin, subcutaneous tissue, muscle, periosteum, and bone left tibia. 2.  Primary closure of 13 cm traumatic wound. 3.  Application of incisional negative pressure dressing. 4.  Closed management of left tibia shaft fracture without manipulation.  ANESTHESIA:   GETA.  ANTIBIOTICS: 900 mg clindamycin. 2.  1 g aztreonam.  TUBES AND DRAINS: Prevena negative pressure incisional wound VAC at 125 mmHg.    IMPLANTS: None.  SPECIMENS: None.  COMPLICATIONS: None.  DISPOSITION: Stable to PACU.  SURGICAL INDICATIONS:  John Pierce is a 38 y.o. male who sustained a chain saw injury to the left lower leg earlier this evening.  He was brought to the emergency department at [facility], where x-rays revealed an incomplete cortical fracture to the tibia shaft.  He was found to have a wound over the medial aspect of the lower leg.  He was indicated for irrigation and debridement of the lower extremity with primary wound closure.  The risks, benefits, and alternatives were discussed with the patient preoperatively including but not limited to the risks of infection, bleeding, nerve / blood vessel injury, malunion, nonunion, cardiopulmonary complications, the need for repeat surgery, among others, and the patient was willing to proceed.  PROCEDURE IN DETAIL: Patient was identified in the holding area using 2 identifiers.  The surgical site was marked by myself.  He was taken to the operating room, and placed supine on the operating room table.  All bony prominences were well-padded.  The left lower extremity was prepped and draped in the normal sterile surgical fashion.  Timeout was called, verifying site and  site of surgery.  He did receive IV antibiotics within 60 minutes of beginning the procedure.  Began by examining the wound.  He had an oblique 13 cm wound over the medial aspect of the lower leg.  The skin edges were macerated and there were several areas of local devitalization.  Using a #15 blade, I excisionally debrided all nonviable skin edges.  There was a small amount of dirt contamination to the subcutaneous, muscular, and periosteal/bone level.  All dirt was excisionally debrided with a rongeur.  All nonviable subcutaneous fat was excisionally debrided with a rongeur.  The saphenous nerve was identified and found to be transected.  Gentle traction was applied onto the nerve, and it was sharply transected with a #10 blade.  There was a partial-thickness laceration to the FDL muscle belly.  Devitalized/stripped periosteal tissue was excisionally debrided with a rongeur.  The fracture was identified.  There were 2 cortical defects from the blade of the chainsaw that were partial-thickness; the intramedullary cavity was not exposed.  The jagged cortical edges were smoothed and debrided with a rongeur.    Once I was satisfied with the debridement, 6 L of normal saline were irrigated with pulse lavage.  Hemostasis was achieved with Bovie electrocautery.  The wound was then closed with a combination of trauma stitch and vertical mattress suture using #2 nylon.  Customizable Prevena incisional wound VAC dressing was placed according to manufacturer's instructions, and the suction was applied at 125 mmHg without any evidence of leak.  Patient was then extubated, taken to the PACU in stable condition.  Sponge, needle, and instrument  counts were correct at the end of the case x2.  There were no known complications.  POSTOPERATIVE PLAN: Postoperatively, the patient be admitted for 24 hours of IV antibiotics.  We will obtain a cam walker boot.  He may weight-bear as tolerated in the cam walker boot.  Mobilize him  out of bed tomorrow with physical therapy.  After his IV antibiotics are completed, he can be discharged home with a short course of oral antibiotics.  At the time of discharge, the house VAC suction unit will be switched to the portable Prevena unit.  He is instructed to follow-up in the office this upcoming Friday for removal of the wound VAC.

## 2019-01-16 NOTE — ED Provider Notes (Signed)
[facility] EMERGENCY DEPARTMENT Provider Note   CSN: [id number] Arrival date & time: 01/16/19  1828     History   Chief Complaint Chief Complaint  Patient presents with  . Leg Injury    HPI John Pierce is a 38 y.o. male presenting to the emergency department with sudden onset of injury to left lower leg that occurred prior to arrival using a chainsaw.  He states the chainsaw got stuck in a tree and when it was freed it kicked back and hit him in the left lower leg along the medial aspect.  He is having severe pain associated with this.  EMS dressed the wound and administered fentanyl for pain.  He states the medication initially helped, however his pain returned and he is in significant pain now.  Denies numbness to the foot.  Denies history of immunocompromise.  No history of drug use.  His last tetanus shot was sometime last year.  Not on anticoagulation.  He states he had a rash on his chest from Keflex.     The history is provided by the patient.    Past Medical History:  Diagnosis Date  . ADHD (attention deficit hyperactivity disorder)   . Bipolar 1 disorder ([place])   . Cellulitis   . Diverticulitis   . GAD (generalized anxiety disorder)     There are no active problems to display for this patient.   History reviewed. No pertinent surgical history.      Home Medications    Prior to Admission medications   Medication Sig Start Date End Date Taking? Authorizing Provider  ALPRAZolam (XANAX XR) 0.5 MG 24 hr tablet Take 0.5 mg by mouth as needed for anxiety (as needed).    Yes [provider]  amphetamine-dextroamphetamine (ADDERALL XR) 30 MG 24 hr capsule Take 30 mg 2 (two) times daily by mouth. 1 tab in AM, 1 tab at noon, and half tab afternoon   Yes [provider]  sildenafil (REVATIO) 20 MG tablet Take 20 mg by mouth See admin instructions. Take 1 hour prior to sexual activity 01/19/18  Yes [provider]     Family History History reviewed. No pertinent family history.  Social History Social History   Tobacco Use  . Smoking status: Current Every Day Smoker    Packs/day: 1.00    Years: 15.00    Pack years: 15.00    Types: Cigarettes  . Smokeless tobacco: Never Used  Substance Use Topics  . Alcohol use: Yes    Comment: occasional   . Drug use: No     Allergies   Keflex [cephalexin]   Review of Systems Review of Systems  Musculoskeletal: Positive for myalgias.  Skin: Positive for wound.  Neurological: Negative for numbness.  Hematological: Does not bruise/bleed easily.  All other systems reviewed and are negative.    Physical Exam Updated Vital Signs BP 117/77   Pulse 72   Temp 98 F (36.7 C)   Resp 13   SpO2 95%   Physical Exam Vitals signs and nursing note reviewed.  Constitutional:      Appearance: He is well-developed.  HENT:     Head: Normocephalic and atraumatic.  Eyes:     Conjunctiva/sclera: Conjunctivae normal.  Cardiovascular:     Rate and Rhythm: Normal rate and regular rhythm.  Pulmonary:     Effort: Pulmonary effort is normal. No respiratory distress.     Breath sounds: Normal breath sounds.  Abdominal:  Palpations: Abdomen is soft.  Musculoskeletal:     Comments: Left anterior medial lower leg with large gaping wound.  The wound appears to go down to the bone with possible injury to the bone.  Wound is not actively bleeding.  Wound is not grossly contaminated.   Patient is able to actively dorsiflex and plantarflex the foot.  Achilles feels to be intact.  He has normal sensation to all toes with equal DP pulses bilaterally.   Remainder of the leg appears atraumatic.   Skin:    General: Skin is warm.  Neurological:     Mental Status: He is alert.  Psychiatric:        Behavior: Behavior normal.          ED Treatments / Results  Labs (all labs ordered are listed, but only abnormal results are displayed) Labs Reviewed  BASIC  METABOLIC PANEL - Abnormal; Notable for the following components:      Result Value   Creatinine, Ser 1.35 (*)    All other components within normal limits  CBC WITH DIFFERENTIAL/PLATELET - Abnormal; Notable for the following components:   WBC 15.8 (*)    Neutro Abs 12.9 (*)    All other components within normal limits  SARS CORONAVIRUS 2 BY RT PCR (HOSPITAL ORDER, PERFORMED IN [facility])  CBC WITH DIFFERENTIAL/PLATELET    EKG None  Radiology Dg Tibia/fibula Left  Result Date: 01/16/2019 CLINICAL DATA:  Chainsaw injury EXAM: LEFT TIBIA AND FIBULA - 2 VIEW COMPARISON:  None. FINDINGS: There is a large wound extending 3 cm to the underlying lateral tibial cortex where there is cortical irregularity and incomplete fractures of the cortical surface. No complete fracture or displaced fracture fragments are identified. No foreign bodies are seen within the soft tissues. IMPRESSION: 3 cm large wound extending 3 cm extending to the lateral distal tibial cortex where there is incomplete cortical fractures. Electronically Signed   By: Jonna ClarkBindu  Avutu M.D.   On: 01/16/2019 19:33    Procedures Procedures (including critical care time)  Medications Ordered in ED Medications  HYDROmorphone (DILAUDID) injection 2 mg (2 mg Intravenous Given 01/16/19 1841)  ondansetron (ZOFRAN) injection 4 mg (4 mg Intravenous Given 01/16/19 1847)  clindamycin (CLEOCIN) IVPB 900 mg (0 mg Intravenous Stopped 01/16/19 2023)  aztreonam (AZACTAM) 1 g in sodium chloride 0.9 % 100 mL IVPB (0 g Intravenous Stopped 01/16/19 2122)  HYDROmorphone (DILAUDID) injection 2 mg (2 mg Intravenous Given 01/16/19 2017)  ondansetron (ZOFRAN) injection 4 mg (4 mg Intravenous Given 01/16/19 2123)  propofol (DIPRIVAN) 10 mg/mL bolus/IV push (has no administration in time range)  midazolam (VERSED) 2 MG/2ML injection (has no administration in time range)  SUFentanil (SUFENTA) 50 MCG/ML injection (has no administration in time range)      Initial Impression / Assessment and Plan / ED Course  I have reviewed the triage vital signs and the nursing notes.  Pertinent labs & imaging results that were available during my care of the patient were reviewed by me and considered in my medical decision making (see chart for details).        Patient presenting with large wound to left lower leg after chainsaw injury.  Neurovascularly intact, bleeding controlled on arrival.  No history of immunocompromise.  Vital signs are stable.  X-ray with cortical disruption of the tibia.  Consulted Dr. Linna CapriceSwinteck with orthopedic surgery.  Patient will go to the OR for washout.  He is given prophylactic antibiotics for open fracture in  the ED.  Pain controlled with Dilaudid.  Covid is negative.  Final Clinical Impressions(s) / ED Diagnoses   Final diagnoses:  Contact with chainsaw as cause of accidental injury  Laceration of left lower extremity excluding thigh, with complication, initial encounter  Other type III open fracture of shaft of left tibia, initial encounter    ED Discharge Orders    None       Harrison Zetina, [place] N, PA-C 01/16/19 2220    Little, Ambrose Finland, MD 01/20/19 1351

## 2019-01-16 NOTE — Anesthesia Preprocedure Evaluation (Signed)
Anesthesia Evaluation  Patient identified by MRN, date of birth, ID band Patient awake    Reviewed: Allergy & Precautions, NPO status , Patient's Chart, lab work & pertinent test results  Airway Mallampati: II  TM Distance: >3 FB Neck ROM: Full    Dental  (+) Teeth Intact, Dental Advisory Given   Pulmonary Current Smoker,    breath sounds clear to auscultation       Cardiovascular negative cardio ROS   Rhythm:Regular Rate:Normal     Neuro/Psych PSYCHIATRIC DISORDERS Anxiety Bipolar Disorder negative neurological ROS     GI/Hepatic negative GI ROS, Neg liver ROS,   Endo/Other  negative endocrine ROS  Renal/GU negative Renal ROS     Musculoskeletal negative musculoskeletal ROS (+)   Abdominal Normal abdominal exam  (+)   Peds  Hematology negative hematology ROS (+)   Anesthesia Other Findings   Reproductive/Obstetrics                             Anesthesia Physical Anesthesia Plan  ASA: II and emergent  Anesthesia Plan: General   Post-op Pain Management:    Induction: Intravenous, Rapid sequence and Cricoid pressure planned  PONV Risk Score and Plan: 2 and Ondansetron, Dexamethasone and Midazolam  Airway Management Planned: Oral ETT  Additional Equipment: None  Intra-op Plan:   Post-operative Plan: Extubation in OR  Informed Consent: I have reviewed the patients History and Physical, chart, labs and discussed the procedure including the risks, benefits and alternatives for the proposed anesthesia with the patient or authorized representative who has indicated his/her understanding and acceptance.     Dental advisory given  Plan Discussed with: CRNA  Anesthesia Plan Comments:         Anesthesia Quick Evaluation

## 2019-01-16 NOTE — ED Triage Notes (Signed)
Pt bib ems from home after pt dad lost control of chainsaw and lac to LLE. Bleeding controlled pta. CNS intact. Given 110mcg fentanyl and 16.5mg  ketamine en route. 130/90, HR 105 ST, 98% RA.

## 2019-01-16 NOTE — ED Notes (Signed)
Please call spouse [name] @ [phone] once patient gets a bed--John Pierce

## 2019-01-16 NOTE — Discharge Instructions (Signed)
Keep dressing clean and dry.  Do not remove dressing. Charge wound VAC unit nightly. You may readjust the Ace wrap and cast padding underneath as needed. Weightbearing as tolerated in cam walker boot. Call [phone] as soon as possible to schedule a follow-up visit this upcoming Friday for removal of the incisional wound VAC.

## 2019-01-16 NOTE — Anesthesia Procedure Notes (Signed)
Procedure Name: Intubation Date/Time: 01/16/2019 10:28 PM Performed by: Claris Che, CRNA Pre-anesthesia Checklist: Patient identified, Emergency Drugs available, Suction available, Patient being monitored and Timeout performed Patient Re-evaluated:Patient Re-evaluated prior to induction Oxygen Delivery Method: Circle system utilized Preoxygenation: Pre-oxygenation with 100% oxygen Induction Type: IV induction, Rapid sequence and Cricoid Pressure applied Laryngoscope Size: Mac and 4 Grade View: Grade I Tube type: Oral Tube size: 8.0 mm Number of attempts: 1 Airway Equipment and Method: Stylet Placement Confirmation: ETT inserted through vocal cords under direct vision,  positive ETCO2 and breath sounds checked- equal and bilateral Secured at: 24 cm Tube secured with: Tape Dental Injury: Teeth and Oropharynx as per pre-operative assessment

## 2019-01-16 NOTE — Transfer of Care (Signed)
Immediate Anesthesia Transfer of Care Note  Patient: John Pierce  Procedure(s) Performed: IRRIGATION AND DEBRIDEMENT EXTREMITY (Left Leg Lower)  Patient Location: PACU  Anesthesia Type:General  Level of Consciousness: sedated, drowsy, patient cooperative and responds to stimulation  Airway & Oxygen Therapy: Patient Spontanous Breathing and Patient connected to nasal cannula oxygen  Post-op Assessment: Report given to RN, Post -op Vital signs reviewed and stable and Patient moving all extremities X 4  Post vital signs: Reviewed and stable  Last Vitals:  Vitals Value Taken Time  BP    Temp    Pulse 69 01/16/19 2358  Resp 15 01/16/19 2358  SpO2 96 % 01/16/19 2358  Vitals shown include unvalidated device data.  Last Pain:  Vitals:   01/16/19 2054  PainSc: 7          Complications: No apparent anesthesia complications

## 2019-01-17 ENCOUNTER — Encounter (HOSPITAL_COMMUNITY): Payer: Self-pay | Admitting: Orthopedic Surgery

## 2019-01-17 DIAGNOSIS — S82202B Unspecified fracture of shaft of left tibia, initial encounter for open fracture type I or II: Secondary | ICD-10-CM | POA: Diagnosis present

## 2019-01-17 LAB — CBC
HCT: 39.5 % (ref 39.0–52.0)
Hemoglobin: 12.9 g/dL — ABNORMAL LOW (ref 13.0–17.0)
MCH: 29.7 pg (ref 26.0–34.0)
MCHC: 32.7 g/dL (ref 30.0–36.0)
MCV: 90.8 fL (ref 80.0–100.0)
Platelets: 320 10*3/uL (ref 150–400)
RBC: 4.35 MIL/uL (ref 4.22–5.81)
RDW: 13.6 % (ref 11.5–15.5)
WBC: 13.3 10*3/uL — ABNORMAL HIGH (ref 4.0–10.5)
nRBC: 0 % (ref 0.0–0.2)

## 2019-01-17 MED ORDER — HYDROCODONE-ACETAMINOPHEN 5-325 MG PO TABS
1.0000 | ORAL_TABLET | ORAL | Status: DC | PRN
  Administered 2019-01-17 (×2): 2 via ORAL
  Filled 2019-01-17 (×2): qty 2

## 2019-01-17 MED ORDER — CLINDAMYCIN PHOSPHATE 600 MG/50ML IV SOLN
600.0000 mg | Freq: Four times a day (QID) | INTRAVENOUS | Status: AC
  Administered 2019-01-17 (×4): 600 mg via INTRAVENOUS
  Filled 2019-01-17 (×4): qty 50

## 2019-01-17 MED ORDER — OXYCODONE HCL 5 MG PO TABS: 5.0000 mg | ORAL_TABLET | ORAL | 0 refills | Status: DC | PRN

## 2019-01-17 MED ORDER — MORPHINE SULFATE (PF) 2 MG/ML IV SOLN
0.5000 mg | INTRAVENOUS | Status: DC | PRN
  Administered 2019-01-17: 0.5 mg via INTRAVENOUS
  Administered 2019-01-17 (×3): 1 mg via INTRAVENOUS
  Filled 2019-01-17 (×4): qty 1

## 2019-01-17 MED ORDER — METHOCARBAMOL 1000 MG/10ML IJ SOLN
500.0000 mg | Freq: Four times a day (QID) | INTRAVENOUS | Status: DC | PRN
  Filled 2019-01-17 (×2): qty 5

## 2019-01-17 MED ORDER — ACETAMINOPHEN 10 MG/ML IV SOLN
INTRAVENOUS | Status: AC
  Administered 2019-01-17: 1000 mg via INTRAVENOUS
  Filled 2019-01-17: qty 100

## 2019-01-17 MED ORDER — AMPHETAMINE-DEXTROAMPHET ER 5 MG PO CP24
30.0000 mg | ORAL_CAPSULE | ORAL | Status: DC
  Administered 2019-01-17 (×2): 30 mg via ORAL
  Filled 2019-01-17 (×2): qty 6

## 2019-01-17 MED ORDER — ONDANSETRON HCL 4 MG PO TABS: 4.0000 mg | ORAL_TABLET | Freq: Three times a day (TID) | ORAL | 0 refills | Status: DC | PRN

## 2019-01-17 MED ORDER — METOCLOPRAMIDE HCL 5 MG PO TABS: 5.0000 mg | ORAL_TABLET | Freq: Three times a day (TID) | ORAL | Status: DC | PRN

## 2019-01-17 MED ORDER — ACETAMINOPHEN 500 MG PO TABS: 1000.0000 mg | ORAL_TABLET | Freq: Three times a day (TID) | ORAL | 0 refills | Status: DC

## 2019-01-17 MED ORDER — CIPROFLOXACIN HCL 500 MG PO TABS: 500.0000 mg | ORAL_TABLET | Freq: Two times a day (BID) | ORAL | 0 refills | Status: AC

## 2019-01-17 MED ORDER — HYDROCODONE-ACETAMINOPHEN 7.5-325 MG PO TABS: 1.0000 | ORAL_TABLET | ORAL | Status: DC | PRN

## 2019-01-17 MED ORDER — ENOXAPARIN SODIUM 40 MG/0.4ML ~~LOC~~ SOLN
40.0000 mg | SUBCUTANEOUS | Status: DC
  Administered 2019-01-17: 40 mg via SUBCUTANEOUS
  Filled 2019-01-17: qty 0.4

## 2019-01-17 MED ORDER — SENNA 8.6 MG PO TABS
1.0000 | ORAL_TABLET | Freq: Two times a day (BID) | ORAL | Status: DC
  Administered 2019-01-17: 10:00:00 8.6 mg via ORAL
  Filled 2019-01-17: qty 1

## 2019-01-17 MED ORDER — HYDROCODONE-ACETAMINOPHEN 5-325 MG PO TABS: 1.0000 | ORAL_TABLET | ORAL | 0 refills | Status: DC | PRN

## 2019-01-17 MED ORDER — METOCLOPRAMIDE HCL 5 MG/ML IJ SOLN: 5.0000 mg | Freq: Three times a day (TID) | INTRAMUSCULAR | Status: DC | PRN

## 2019-01-17 MED ORDER — KETOROLAC TROMETHAMINE 15 MG/ML IJ SOLN
15.0000 mg | Freq: Four times a day (QID) | INTRAMUSCULAR | Status: AC
  Administered 2019-01-17 (×4): 15 mg via INTRAVENOUS
  Filled 2019-01-17 (×3): qty 1

## 2019-01-17 MED ORDER — ALPRAZOLAM 0.5 MG PO TABS: 0.5000 mg | ORAL_TABLET | Freq: Every day | ORAL | Status: DC | PRN

## 2019-01-17 MED ORDER — ONDANSETRON HCL 4 MG/2ML IJ SOLN: 4.0000 mg | Freq: Four times a day (QID) | INTRAMUSCULAR | Status: DC | PRN

## 2019-01-17 MED ORDER — METHOCARBAMOL 500 MG PO TABS
500.0000 mg | ORAL_TABLET | Freq: Four times a day (QID) | ORAL | Status: DC | PRN
  Administered 2019-01-17: 500 mg via ORAL
  Filled 2019-01-17 (×2): qty 1

## 2019-01-17 MED ORDER — DOCUSATE SODIUM 100 MG PO CAPS: 100.0000 mg | ORAL_CAPSULE | Freq: Two times a day (BID) | ORAL | 1 refills | Status: AC

## 2019-01-17 MED ORDER — SODIUM CHLORIDE 0.9 % IV SOLN
INTRAVENOUS | Status: DC
  Administered 2019-01-17: 02:00:00 via INTRAVENOUS

## 2019-01-17 MED ORDER — KETOROLAC TROMETHAMINE 15 MG/ML IJ SOLN
INTRAMUSCULAR | Status: AC
  Administered 2019-01-17: 15 mg via INTRAVENOUS
  Filled 2019-01-17: qty 1

## 2019-01-17 MED ORDER — ONDANSETRON HCL 4 MG PO TABS: 4.0000 mg | ORAL_TABLET | Freq: Four times a day (QID) | ORAL | Status: DC | PRN

## 2019-01-17 MED ORDER — ACETAMINOPHEN 325 MG PO TABS: 325.0000 mg | ORAL_TABLET | Freq: Four times a day (QID) | ORAL | Status: DC | PRN

## 2019-01-17 MED ORDER — DOCUSATE SODIUM 100 MG PO CAPS
100.0000 mg | ORAL_CAPSULE | Freq: Two times a day (BID) | ORAL | Status: DC
  Administered 2019-01-17: 100 mg via ORAL
  Filled 2019-01-17: qty 1

## 2019-01-17 MED ORDER — GENTAMICIN SULFATE 40 MG/ML IJ SOLN
400.0000 mg | Freq: Once | INTRAVENOUS | Status: AC
  Administered 2019-01-17: 400 mg via INTRAVENOUS
  Filled 2019-01-17: qty 10

## 2019-01-17 MED ORDER — SENNA 8.6 MG PO TABS: 2.0000 | ORAL_TABLET | Freq: Every day | ORAL | Status: AC

## 2019-01-17 NOTE — H&P (Signed)
ORTHOPAEDIC CONSULTATION  REQUESTING PHYSICIAN: Little, Ambrose Finland, MD  PCP:  Alain Honey, MD  Chief Complaint: Chainsaw injury left lower extremity  HPI: John Pierce is a 38 y.o. male who complains of chainsaw injury to the left lower extremity earlier tonight.  He complains of subjective sensory change on the medial aspect of the foot.  No other injuries.  X-rays revealed small cortical defect to the tibia without complete fracture.  His tetanus is up-to-date.  He was given IV clindamycin and aztreonam in the emergency department due to cephalosporin allergy.  He smokes 1/2 pack/day.      Past Medical History:  Diagnosis Date  . ADHD (attention deficit hyperactivity disorder)   . Bipolar 1 disorder ([facility])   . Cellulitis   . Diverticulitis   . GAD (generalized anxiety disorder)    History reviewed. No pertinent surgical history. Social History        Socioeconomic History  . Marital status: Married    Spouse name: Not on file  . Number of children: Not on file  . Years of education: Not on file  . Highest education level: Not on file  Occupational History  . Not on file  Social Needs  . Financial resource strain: Not on file  . Food insecurity    Worry: Not on file    Inability: Not on file  . Transportation needs    Medical: Not on file    Non-medical: Not on file  Tobacco Use  . Smoking status: Current Every Day Smoker    Packs/day: 1.00    Years: 15.00    Pack years: 15.00    Types: Cigarettes  . Smokeless tobacco: Never Used  Substance and Sexual Activity  . Alcohol use: Yes    Comment: occasional   . Drug use: No  . Sexual activity: Not on file  Lifestyle  . Physical activity    Days per week: Not on file    Minutes per session: Not on file  . Stress: Not on file  Relationships  . Social Musician on phone: Not on file    Gets together: Not on file    Attends religious service:  Not on file    Active member of club or organization: Not on file    Attends meetings of clubs or organizations: Not on file    Relationship status: Not on file  Other Topics Concern  . Not on file  Social History Narrative  . Not on file   History reviewed. No pertinent family history.      Allergies  Allergen Reactions  . Keflex [Cephalexin] Rash    States he is not completely positive Keflex is the name of the medicine he is allergic to   Prior to Admission medications   Medication Sig Start Date End Date Taking? Authorizing Provider  ALPRAZolam (XANAX XR) 0.5 MG 24 hr tablet Take 0.5 mg by mouth as needed for anxiety (as needed).    Yes [provider]  amphetamine-dextroamphetamine (ADDERALL XR) 30 MG 24 hr capsule Take 30 mg 2 (two) times daily by mouth. 1 tab in AM, 1 tab at noon, and half tab afternoon   Yes [provider]  sildenafil (REVATIO) 20 MG tablet Take 20 mg by mouth See admin instructions. Take 1 hour prior to sexual activity 01/19/18  Yes [provider]    Imaging Results (Last 48 hours)  Dg Tibia/fibula Left  Result Date: 01/16/2019 CLINICAL DATA:  Chainsaw injury EXAM: LEFT TIBIA AND FIBULA - 2 VIEW COMPARISON:  None. FINDINGS: There is a large wound extending 3 cm to the underlying lateral tibial cortex where there is cortical irregularity and incomplete fractures of the cortical surface. No complete fracture or displaced fracture fragments are identified. No foreign bodies are seen within the soft tissues. IMPRESSION: 3 cm large wound extending 3 cm extending to the lateral distal tibial cortex where there is incomplete cortical fractures. Electronically Signed   By: Prudencio Pair M.D.   On: 01/16/2019 19:33     Positive ROS: All other systems have been reviewed and were otherwise negative with the exception of those mentioned in the HPI and as above.  Physical Exam: General: Alert, no acute distress  Cardiovascular: No pedal edema Respiratory: No cyanosis, no use of accessory musculature GI: No organomegaly, abdomen is soft and non-tender Skin: No lesions in the area of chief complaint Neurologic: Sensation intact distally Psychiatric: Patient is competent for consent with normal mood and affect Lymphatic: No axillary or cervical lymphadenopathy  MUSCULOSKELETAL: Examination of the left lower extremity reveals approximate 10 cm oblique wound at the junction of the middle and distal thirds of the tibia over the anterior medial border.  The laceration goes down to bone.  He has subjective sensory change to the saphenous distribution.  He reports mild subjective sensory change to the superficial peroneal distribution.  He has intact sensation to the deep peroneal, sural, and posterior tibial distributions.  Positive motor function dorsiflexion, plantarflexion, great toe extension with strength limited by pain.  Palpable pedal pulses.  Assessment: Chainsaw injury left lower extremity with incomplete cortical defect of tibia.  Technically, grade 2 open tibia fracture.  Plan: I discussed the findings with the patient.  The injury requires operative debridement and closure.  I will explore the fracture intraoperatively.  We discussed the risk, benefits, and alternatives which include, but are not limited to, the risk of anesthesia (death), bleeding, infection, damage to blood vessels/nerves, delayed wound healing, completion of the fracture, reoperation.  He will likely be able to weight-bear as tolerated with a cam walker boot postop.  Plan to admit him for 24 hours of IV antibiotics and discharge him on a short course of oral antibiotics.  Mobilize out of bed tomorrow.  All questions solicited and answered.    Bertram Savin, MD [phone]

## 2019-01-17 NOTE — Plan of Care (Signed)

## 2019-01-17 NOTE — Progress Notes (Signed)
Patient discharged to home with wife @ 1810. D/C summary reviewed with patient. RN explained wound vac care and F/u provider visit as well as new medications. Patient verbalized understanding and voiced no concerns.

## 2019-01-17 NOTE — Anesthesia Postprocedure Evaluation (Signed)
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  Procedure(s) Performed: IRRIGATION AND DEBRIDEMENT EXTREMITY (Left Leg Lower)     Patient location during evaluation: PACU Anesthesia Type: General Level of consciousness: awake and alert Pain management: pain level controlled Vital Signs Assessment: post-procedure vital signs reviewed and stable Respiratory status: spontaneous breathing, nonlabored ventilation, respiratory function stable and patient connected to nasal cannula oxygen Cardiovascular status: blood pressure returned to baseline and stable Postop Assessment: no apparent nausea or vomiting Anesthetic complications: no    Last Vitals:  Vitals:   01/17/19 0035 01/17/19 0053  BP: 100/63 108/73  Pulse: 61 66  Resp: (!) 9 17  Temp:  36.7 C  SpO2: 100% 100%    Last Pain:  Vitals:   01/17/19 0053  TempSrc: Oral  PainSc:                  Effie Berkshire

## 2019-01-17 NOTE — Progress Notes (Signed)
Called to the room by staff Nurse Shanon Brow, pt expressing concerns of phone being left down in the ED. Call made out to [facility] Design[facility], television/film set) and confirmation that pt's phone was given to wife.  Call back to room from staff nurse, pt with c/o leaving [place] pt stated  " I was told my wife could stay with me".  The patient threatened to leave unless wife could stay at the bedside. The pt was informed of hospital visiting  policy. John AC while rounding was  updated with pt concerns about the visitation policy. John AC at the bedside to speak with patient.   Per Morey Hummingbird, patient adamant to leave AMA unless wife can stay. Pt being verbally abusive and using foul language. Per Morey Hummingbird, make a call out to Dr. Lyla Glassing to make aware of the situation regarding patient and possibility of AMA.   Verbal order taken by charge nurse from Dr. Lyla Glassing, updated Morey Hummingbird. See order. Pt with request for new nurse, Rushana to take this patient.   Rushana and charge nurse at the bedside, clarified unit patient policies and procedures. Pt and significant other agreeable with instructions and oriented to room environment.

## 2019-01-17 NOTE — Evaluation (Signed)
Physical Therapy Evaluation Patient Details Name: John Pierce MRN: [id number] DOB: [birth year] Today's Date: 01/17/2019   History of Present Illness  John Pierce is a 38 y.o. male who complains of chainsaw injury to the left lower extremity earlier tonight.  He complains of subjective sensory change on the medial aspect of the foot.  No other injuries.  X-rays revealed small cortical defect to the tibia without complete fracture. s/p  Excisional debridement of skin, subcutaneous tissue, muscle, periosteum, and bone left tibia.  Clinical Impression  Patient evaluated by Physical Therapy with no further acute PT needs identified. All education has been completed and the patient has no further questions. Overall managing well in boot, and using a rW and a cane; Questions solicited and answered; OK for dc home from PT standpoint  See below for any follow-up Physical Therapy or equipment needs. PT is signing off. Thank you for this referral.     Follow Up Recommendations Outpatient PT(The potential need for Outpatient PT can be addressed at Ortho follow-up appointments. )    Equipment Recommendations  Rolling walker with 5" wheels    Recommendations for Other Services       Precautions / Restrictions Precautions Required Braces or Orthoses: Other Brace Other Brace: Cam boot Restrictions LLE Weight Bearing: Weight bearing as tolerated      Mobility  Bed Mobility Overal bed mobility: Independent                Transfers Overall transfer level: Modified independent               General transfer comment: no overt difficulty  Ambulation/Gait Ambulation/Gait assistance: Supervision Gait Distance (Feet): 140 Feet(x2) Assistive device: Rolling walker (2 wheeled);Straight cane Gait Pattern/deviations: Step-through pattern(emerging)     General Gait Details: Walked to ortho gym to practice stairs with RW without difficulty; walked back to room with straight  cane, and used it correctly; just a few small losses of balance due to impulsivity prior to practicing stairs  Stairs Stairs: Yes Stairs assistance: Min guard Stair Management: No rails;Step to pattern;Forwards Number of Stairs: 2(x2) General stair comments: Described multiple ways to manage steps to enter home, and ultimately he decided to go up with arm-over-shoulder assist from wife; they both practiced and did well  Wheelchair Mobility    Modified Rankin (Stroke Patients Only)       Balance Overall balance assessment: Mild deficits observed, not formally tested                                           Pertinent Vitals/Pain Pain Assessment: 0-10 Pain Score: 8  Pain Location: L lower leg Pain Descriptors / Indicators: Aching Pain Intervention(s): Monitored during session    Home Living Family/patient expects to be discharged to:: Private residence Living Arrangements: Spouse/significant other Available Help at Discharge: Family Type of Home: House Home Access: Stairs to enter Entrance Stairs-Rails: None Entrance Stairs-Number of Steps: 3 Home Layout: Two level;Able to live on main level with bedroom/bathroom Home Equipment: Cane - single point;Crutches;Shower seat      Prior Function Level of Independence: Independent         Comments: Has been in CAM boot before due to previous injury R foot     Hand Dominance        Extremity/Trunk Assessment   Upper Extremity Assessment Upper Extremity Assessment: Overall WFL for tasks  assessed    Lower Extremity Assessment Lower Extremity Assessment: LLE deficits/detail LLE Deficits / Details: Hip and knee WFL; noted active ankle dorsiflexion to very near neutral; postive active toe wiggle       Communication   Communication: No difficulties  Cognition Arousal/Alertness: Awake/alert Behavior During Therapy: WFL for tasks assessed/performed Overall Cognitive Status: Within Functional Limits  for tasks assessed                                        General Comments      Exercises     Assessment/Plan    PT Assessment All further PT needs can be met in the next venue of care  PT Problem List Decreased strength;Decreased range of motion;Decreased activity tolerance;Decreased balance;Decreased mobility;Decreased knowledge of use of DME;Pain       PT Treatment Interventions      PT Goals (Current goals can be found in the Care Plan section)  Acute Rehab PT Goals Patient Stated Goal: Hopes to go home today PT Goal Formulation: All assessment and education complete, DC therapy    Frequency     Barriers to discharge        Co-evaluation               AM-PAC PT "6 Clicks" Mobility  Outcome Measure Help needed turning from your back to your side while in a flat bed without using bedrails?: None Help needed moving from lying on your back to sitting on the side of a flat bed without using bedrails?: None Help needed moving to and from a bed to a chair (including a wheelchair)?: None Help needed standing up from a chair using your arms (e.g., wheelchair or bedside chair)?: None Help needed to walk in hospital room?: A Little Help needed climbing 3-5 steps with a railing? : A Little 6 Click Score: 22    End of Session Equipment Utilized During Treatment: Gait belt Activity Tolerance: Patient tolerated treatment well Patient left: in chair;with call bell/phone within reach;with family/visitor present Nurse Communication: Mobility status PT Visit Diagnosis: Other abnormalities of gait and mobility (R26.89)    Time: 1300-1336 PT Time Calculation (min) (ACUTE ONLY): 36 min   Charges:   PT Evaluation $PT Eval Low Complexity: 1 Low PT Treatments $Gait Training: 8-22 mins        Roney Marion, PT  Acute Rehabilitation Services Pager [phone] Office [phone]   Colletta [place] 01/17/2019, 3:15 PM

## 2019-01-17 NOTE — Progress Notes (Signed)
Patient ID: John Pierce, male   DOB: [birth year], 38 y.o.   MRN: [id number] Subjective: 1 Day Post-Op Procedure(s) (LRB): IRRIGATION AND DEBRIDEMENT EXTREMITY (Left)    Patient reports pain as moderate.  Finally, feels that pain is stabilized after procedure No events  Objective:   VITALS:   Vitals:   01/17/19 0053 01/17/19 0329  BP: 108/73 120/72  Pulse: 66 65  Resp: 17 19  Temp: 98 F (36.7 C) 97.7 F (36.5 C)  SpO2: 100% 100%    Incision: dressing C/D/I, left leg ACE wrap dry, suction drain appears to be functioning Pre-op noted numbness over medial leg and foot distal to laceration  LABS Recent Labs    01/16/19 2015 01/17/19 0525  HGB 13.8 12.9*  HCT 42.3 39.5  WBC 15.8* 13.3*  PLT 352 320    Recent Labs    01/16/19 1827  NA 141  K 3.6  BUN 20  CREATININE 1.35*  GLUCOSE 96    No results for input(s): LABPT, INR in the last 72 hours.   Assessment/Plan: 1 Day Post-Op Procedure(s) (LRB): IRRIGATION AND DEBRIDEMENT EXTREMITY (Left)  Plan: Finish course of antibiotics as prescribed following this injury PT eval Place left foot and ankle into CAM walker boot WBAT To follow up with [place] Friday for dressing change

## 2019-01-17 NOTE — TOC Transition Note (Signed)
Transition of Care [facility]) - CM/SW Discharge Note   Patient Details  Name: John Pierce MRN: [id number] Date of Birth: 1980-02-20  Transition of Care [facility]) CM/SW Contact:  Carles Collet, RN Phone Number: 01/17/2019, 10:16 AM   Clinical Narrative:   Spoke w patient over the phone. He states that he has crutches at home and would like the RW for DC.  Notified liaison w [employer] to deliver to room prior to DC today. No other CM needs identified    Final next level of care: Home/Self Care Barriers to Discharge: No Barriers Identified   Patient Goals and CMS Choice        Discharge Placement                       Discharge Plan and Services                DME Arranged: Walker rolling DME Agency: [employer] Date DME Agency Contacted: 01/17/19 Time DME Agency Contacted: [id number] Representative spoke with at DME Agency: [place] ([place]) Interventions     Readmission Risk Interventions No flowsheet data found.

## 2019-01-17 NOTE — Progress Notes (Signed)
Orthopedic Tech Progress Note Patient Details:  John Pierce 1980/10/01 [id number]  Ortho Devices Type of Ortho Device: CAM walker Ortho Device/Splint Location: delivered to room Ortho Device/Splint Interventions: Renard Matter 01/17/2019, 3:33 AM

## 2019-01-19 NOTE — Discharge Summary (Signed)
Physician Discharge Summary  Patient ID: John Pierce MRN: [id number] DOB/AGE: August 04, 1980 38 y.o.  Admit date: 01/16/2019 Discharge date: 01/19/2019  Admission Diagnoses:  Open left tibial fracture  Discharge Diagnoses:  Principal Problem:   Open left tibial fracture Active Problems:   Laceration of left lower extremity   Past Medical History:  Diagnosis Date  . ADHD (attention deficit hyperactivity disorder)   . Bipolar 1 disorder (HCC)   . Cellulitis   . Diverticulitis   . GAD (generalized anxiety disorder)     Surgeries: Procedure(s): IRRIGATION AND DEBRIDEMENT EXTREMITY on 01/16/2019   Consultants (if any): Treatment Team:  Samson FredericSwinteck, Jerzey Komperda, MD  Discharged Condition: Improved  Hospital Course: John Pierce is an 38 y.o. male who was admitted 01/16/2019 with a diagnosis of Open left tibial fracture and went to the operating room on 01/16/2019 and underwent the above named procedures.    He was given perioperative antibiotics:  Anti-infectives (From admission, onward)   Start     Dose/Rate Route Frequency Ordered Stop   01/17/19 0200  clindamycin (CLEOCIN) IVPB 600 mg     600 mg 100 mL/hr over 30 Minutes Intravenous Every 6 hours 01/17/19 0147 01/17/19 1745   01/17/19 0200  gentamicin (GARAMYCIN) 400 mg in dextrose 5 % 100 mL IVPB     400 mg 110 mL/hr over 60 Minutes Intravenous  Once 01/17/19 0153 01/17/19 0503   01/17/19 0000  ciprofloxacin (CIPRO) 500 MG tablet     500 mg Oral 2 times daily 01/17/19 0010 01/22/19 2359   01/16/19 2245  vancomycin (VANCOCIN) IVPB 1000 mg/200 mL premix  Status:  Discontinued     1,000 mg 200 mL/hr over 60 Minutes Intravenous  Once 01/16/19 2236 01/17/19 0147   01/16/19 1900  clindamycin (CLEOCIN) IVPB 600 mg  Status:  Discontinued     600 mg 100 mL/hr over 30 Minutes Intravenous  Once 01/16/19 1850 01/16/19 1853   01/16/19 1900  gentamicin (GARAMYCIN) 1.5 mg/kg in dextrose 5 % 50 mL IVPB  Status:  Discontinued     1.5  mg/kg 100 mL/hr over 30 Minutes Intravenous  Once 01/16/19 1850 01/16/19 1851   01/16/19 1900  clindamycin (CLEOCIN) IVPB 900 mg     900 mg 100 mL/hr over 30 Minutes Intravenous  Once 01/16/19 1853 01/16/19 2023   01/16/19 1900  aztreonam (AZACTAM) 1 g in sodium chloride 0.9 % 100 mL IVPB     1 g 200 mL/hr over 30 Minutes Intravenous  Once 01/16/19 1853 01/16/19 2122    .  He was given sequential compression devices, early ambulation, and ASA for DVT prophylaxis.  He benefited maximally from the hospital stay and there were no complications.    Recent vital signs:  Vitals:   01/17/19 0808 01/17/19 1453  BP: 106/60 131/68  Pulse: 65 72  Resp:    Temp: 98.3 F (36.8 C) 98.2 F (36.8 C)  SpO2: 100% 100%    Recent laboratory studies:  Lab Results  Component Value Date   HGB 12.9 (L) 01/17/2019   HGB 13.8 01/16/2019   HGB 14.9 06/30/2017   Lab Results  Component Value Date   WBC 13.3 (H) 01/17/2019   PLT 320 01/17/2019   No results found for: INR Lab Results  Component Value Date   NA 141 01/16/2019   K 3.6 01/16/2019   CL 105 01/16/2019   CO2 23 01/16/2019   BUN 20 01/16/2019   CREATININE 1.35 (H) 01/16/2019   GLUCOSE 96 01/16/2019  Discharge Medications:   Allergies as of 01/17/2019      Reactions   Keflex [cephalexin] Rash   States he is not completely positive Keflex is the name of the medicine he is allergic to      Medication List    TAKE these medications   acetaminophen 500 MG tablet Commonly known as: TYLENOL Take 2 tablets (1,000 mg total) by mouth every 8 (eight) hours.   ALPRAZolam 0.5 MG 24 hr tablet Commonly known as: XANAX XR Take 0.5 mg by mouth as needed for anxiety (as needed).   amphetamine-dextroamphetamine 30 MG 24 hr capsule Commonly known as: ADDERALL XR Take 30 mg 2 (two) times daily by mouth. 1 tab in AM, 1 tab at noon, and half tab afternoon   ciprofloxacin 500 MG tablet Commonly known as: Cipro Take 1 tablet (500 mg  total) by mouth 2 (two) times daily for 5 days.   docusate sodium 100 MG capsule Commonly known as: Colace Take 1 capsule (100 mg total) by mouth 2 (two) times daily.   ondansetron 4 MG tablet Commonly known as: Zofran Take 1 tablet (4 mg total) by mouth every 8 (eight) hours as needed for nausea or vomiting.   oxyCODONE 5 MG immediate release tablet Commonly known as: Oxy IR/ROXICODONE Take 1-2 tablets (5-10 mg total) by mouth every 4 (four) hours as needed for moderate pain or severe pain.   senna 8.6 MG Tabs tablet Commonly known as: SENOKOT Take 2 tablets (17.2 mg total) by mouth at bedtime.   sildenafil 20 MG tablet Commonly known as: REVATIO Take 20 mg by mouth See admin instructions. Take 1 hour prior to sexual activity       Diagnostic Studies: Dg Tibia/fibula Left  Result Date: 01/16/2019 CLINICAL DATA:  Chainsaw injury EXAM: LEFT TIBIA AND FIBULA - 2 VIEW COMPARISON:  None. FINDINGS: There is a large wound extending 3 cm to the underlying lateral tibial cortex where there is cortical irregularity and incomplete fractures of the cortical surface. No complete fracture or displaced fracture fragments are identified. No foreign bodies are seen within the soft tissues. IMPRESSION: 3 cm large wound extending 3 cm extending to the lateral distal tibial cortex where there is incomplete cortical fractures. Electronically Signed   By: Prudencio Pair M.D.   On: 01/16/2019 19:33    Disposition:   Discharge Instructions    Call MD / Call 911   Complete by: As directed    If you experience chest pain or shortness of breath, CALL 911 and be transported to the hospital emergency room.  If you develope a fever above 101 F, pus (white drainage) or increased drainage or redness at the wound, or calf pain, call your surgeon's office.   Constipation Prevention   Complete by: As directed    Drink plenty of fluids.  Prune juice may be helpful.  You may use a stool softener, such as Colace  (over the counter) 100 mg twice a day.  Use MiraLax (over the counter) for constipation as needed.   Diet - low sodium heart healthy   Complete by: As directed    Increase activity slowly as tolerated   Complete by: As directed       Follow-up Information    Rod Can, MD. Call on 01/22/2019.   Specialty: Orthopedic Surgery Why: For incisional wound VAC removal Contact information: [address] [place] [address] [phone]            Signed:  Iline Oven Jesus Nevills 01/19/2019, 11:32 AM

## 2019-05-11 ENCOUNTER — Emergency Department (HOSPITAL_BASED_OUTPATIENT_CLINIC_OR_DEPARTMENT_OTHER)
Admission: EM | Admit: 2019-05-11 | Discharge: 2019-05-11 | Disposition: A | Discharge status: Home / Self Care | Payer: PRIVATE HEALTH INSURANCE | Attending: Emergency Medicine | Admitting: Emergency Medicine

## 2019-05-11 ENCOUNTER — Other Ambulatory Visit: Payer: Self-pay

## 2019-05-11 ENCOUNTER — Encounter (HOSPITAL_BASED_OUTPATIENT_CLINIC_OR_DEPARTMENT_OTHER): Payer: Self-pay | Admitting: Emergency Medicine

## 2019-05-11 DIAGNOSIS — M1711 Unilateral primary osteoarthritis, right knee: Secondary | ICD-10-CM | POA: Diagnosis not present

## 2019-05-11 DIAGNOSIS — Z79899 Other long term (current) drug therapy: Secondary | ICD-10-CM | POA: Insufficient documentation

## 2019-05-11 DIAGNOSIS — M25461 Effusion, right knee: Secondary | ICD-10-CM | POA: Diagnosis not present

## 2019-05-11 DIAGNOSIS — M25561 Pain in right knee: Secondary | ICD-10-CM | POA: Diagnosis present

## 2019-05-11 DIAGNOSIS — F17210 Nicotine dependence, cigarettes, uncomplicated: Secondary | ICD-10-CM | POA: Diagnosis not present

## 2019-05-11 LAB — SYNOVIAL CELL COUNT + DIFF, W/ CRYSTALS
Crystals, Fluid: NONE SEEN
Lymphocytes-Synovial Fld: 16 % (ref 0–20)
Monocyte-Macrophage-Synovial Fluid: 19 % — ABNORMAL LOW (ref 50–90)
Neutrophil, Synovial: 65 % — ABNORMAL HIGH (ref 0–25)
WBC, Synovial: 6375 /mm3 — ABNORMAL HIGH (ref 0–200)

## 2019-05-11 MED ORDER — OXYCODONE-ACETAMINOPHEN 10-325 MG PO TABS: 1.0000 | ORAL_TABLET | ORAL | 0 refills | Status: AC | PRN

## 2019-05-11 MED ORDER — LIDOCAINE-EPINEPHRINE 1 %-1:100000 IJ SOLN
INTRAMUSCULAR | Status: AC
  Administered 2019-05-11: 1 mL
  Filled 2019-05-11: qty 1

## 2019-05-11 MED ORDER — ONDANSETRON 8 MG PO TBDP: 8.0000 mg | ORAL_TABLET | Freq: Three times a day (TID) | ORAL | 1 refills | Status: DC | PRN

## 2019-05-11 MED ORDER — ONDANSETRON 4 MG PO TBDP
8.0000 mg | ORAL_TABLET | Freq: Once | ORAL | Status: AC
  Administered 2019-05-11: 8 mg via ORAL
  Filled 2019-05-11: qty 2

## 2019-05-11 MED ORDER — KETOROLAC TROMETHAMINE 15 MG/ML IJ SOLN
30.0000 mg | Freq: Once | INTRAMUSCULAR | Status: AC
  Administered 2019-05-11: 30 mg via INTRAVENOUS
  Filled 2019-05-11: qty 2

## 2019-05-11 NOTE — ED Triage Notes (Signed)
Pt injured his right knee on Saturday  Pt was seen by his PCP on Monday and was given a referral to see an orthopaedic  Pt states the pain got worse tonight

## 2019-05-11 NOTE — ED Provider Notes (Signed)
[facility] Provider Note: Lowella Dell, MD, FACEP  CSN: [id number] MRN: [id number] ARRIVAL: 05/11/19 at 0447 ROOM: MH09/[facility]09   CHIEF COMPLAINT  Knee Pain   HISTORY OF PRESENT ILLNESS  05/11/19 5:05 AM John Pierce is a 39 y.o. male who has had pain in his right knee for the past 3 days.  He was playing with his children 3 days ago but is not aware of any specific injury.  The pain is come on gradually and he has developed swelling in that knee as well.  He saw his PCP yesterday who ordered x-rays.  The x-rays showed no acute bony abnormality but did show a moderate effusion.  Overnight his pain worsened and he now rates it as a 10 out of 10 and describes it as burning and sharp.  It is worse with movement of the right knee.  He denies fever.  He has been taking ibuprofen for the pain without adequate relief.  His last dose of ibuprofen was several hours ago, 400 mg.   Past Medical History:  Diagnosis Date  . ADHD (attention deficit hyperactivity disorder)   . Cellulitis   . Diverticulitis   . GAD (generalized anxiety disorder)     Past Surgical History:  Procedure Laterality Date  . FRACTURE SURGERY    . I & D EXTREMITY Left 01/16/2019   Procedure: IRRIGATION AND DEBRIDEMENT EXTREMITY;  Surgeon: Samson Frederic, MD;  Location: [facility] OR;  Service: Orthopedics;  Laterality: Left;    Family History  Problem Relation Age of Onset  . Lymphoma Mother   . Cancer Other   . Heart failure Other   . Diabetes Other     Social History   Tobacco Use  . Smoking status: Current Every Day Smoker    Packs/day: 1.00    Years: 15.00    Pack years: 15.00    Types: Cigarettes  . Smokeless tobacco: Never Used  Substance Use Topics  . Alcohol use: Yes    Comment: occasional   . Drug use: No    Prior to Admission medications   Medication Sig Start Date End Date Taking? Authorizing Provider  ALPRAZolam (XANAX XR) 0.5 MG 24 hr tablet Take 0.5 mg by mouth as needed for  anxiety (as needed).     [provider]  amphetamine-dextroamphetamine (ADDERALL XR) 30 MG 24 hr capsule Take 30 mg 2 (two) times daily by mouth. 1 tab in AM, 1 tab at noon, and half tab afternoon    [provider]  senna (SENOKOT) 8.6 MG TABS tablet Take 2 tablets (17.2 mg total) by mouth at bedtime. 01/17/19   Swinteck, Arlys Jayvion Stefanski, MD  sildenafil (REVATIO) 20 MG tablet Take 20 mg by mouth See admin instructions. Take 1 hour prior to sexual activity 01/19/18   [provider]    Allergies Keflex [cephalexin]   REVIEW OF SYSTEMS  Negative except as noted here or in the History of Present Illness.   PHYSICAL EXAMINATION  Initial Vital Signs Blood pressure (!) 143/83, pulse 79, temperature 98 F (36.7 C), temperature source Oral, resp. rate 20, height 5\' 8"  (1.727 m), weight 83.5 kg, SpO2 98 %.  Examination General: Well-developed, well-nourished male in no acute distress; appearance consistent with age of record HENT: normocephalic; atraumatic Eyes: pupils equal, round and reactive to light; extraocular muscles intact Neck: supple Heart: regular rate and rhythm Lungs: clear to auscultation bilaterally Abdomen: soft; nondistended; nontender; bowel sounds present Extremities: No deformity; pulses normal; large, tender effusion  of right knee without erythema or warmth, decreased range of motion of right knee Neurologic: Awake, alert and oriented; motor function intact in all extremities and symmetric; no facial droop Skin: Warm and dry Psychiatric: Normal mood and affect   RESULTS  Summary of this visit's results, reviewed and interpreted by myself:   EKG Interpretation  Date/Time:    Ventricular Rate:    PR Interval:    QRS Duration:   QT Interval:    QTC Calculation:   R Axis:     Text Interpretation:        Laboratory Studies: No results found for this or any previous visit (from the past 24 hour(s)). Imaging Studies: No results found.  ED  COURSE and MDM  Nursing notes, initial and subsequent vitals signs, including pulse oximetry, reviewed and interpreted by myself.  Vitals:   05/11/19 0456 05/11/19 0502  BP:  (!) 143/83  Pulse:  79  Resp:  20  Temp:  98 F (36.7 C)  TempSrc:  Oral  SpO2:  98%  Weight: 83.5 kg   Height: 5\' 8"  (1.727 m)    Medications  ketorolac (TORADOL) 15 MG/ML injection 30 mg (30 mg Intravenous Given 05/11/19 0513)  lidocaine-EPINEPHrine (XYLOCAINE W/EPI) 1 %-1:100000 (with pres) injection (1 mL  Given 05/11/19 0517)    Arthrocentesis performed and synovial fluid sent for cell count, differential, crystal analysis and culture.  As the fluid will be sent to [facility] for analysis the results will not be available in a reasonable time.  We will discharge the patient with analgesics and have the morning EDP follow-up on results.  Should results show septic joint or gout we will advise the patient accordingly.  PROCEDURES  .Joint Aspiration/Arthrocentesis  Date/Time: 05/11/2019 5:30 AM Performed by: Vasti Yagi, MD Authorized by: Mikaya Bunner, MD   Consent:    Consent obtained:  Verbal   Consent given by:  Patient   Risks discussed:  Pain   Alternatives discussed:  No treatment Location:    Location:  Knee   Knee:  R knee Anesthesia (see MAR for exact dosages):    Anesthesia method:  Local infiltration   Local anesthetic:  Lidocaine 1% WITH epi Procedure details:    Preparation: Patient was prepped and draped in usual sterile fashion     Needle gauge:  18 G   Ultrasound guidance: no     Approach:  Lateral   Aspirate amount:  70mL   Aspirate characteristics:  Cloudy and serous   Steroid injected: no     Specimen collected: yes   Post-procedure details:    Dressing:  Adhesive bandage   Patient tolerance of procedure:  Tolerated well, no immediate complications   ED DIAGNOSES     ICD-10-CM   1. Arthritis of right knee  M17.11   2. Effusion of right knee  M25.461          Shannah Conteh, Jenny Reichmann, MD 05/11/19 [phone]

## 2019-05-14 LAB — BODY FLUID CULTURE: Culture: NO GROWTH

## 2022-08-04 ENCOUNTER — Encounter (HOSPITAL_BASED_OUTPATIENT_CLINIC_OR_DEPARTMENT_OTHER): Payer: Self-pay

## 2022-08-04 ENCOUNTER — Emergency Department (HOSPITAL_BASED_OUTPATIENT_CLINIC_OR_DEPARTMENT_OTHER)
Admission: EM | Admit: 2022-08-04 | Discharge: 2022-08-04 | Disposition: A | Discharge status: Home / Self Care | Payer: Medicaid Other | Attending: Emergency Medicine | Admitting: Emergency Medicine

## 2022-08-04 ENCOUNTER — Other Ambulatory Visit: Payer: Self-pay

## 2022-08-04 DIAGNOSIS — K0889 Other specified disorders of teeth and supporting structures: Secondary | ICD-10-CM | POA: Diagnosis present

## 2022-08-04 MED ORDER — HYDROCODONE-ACETAMINOPHEN 5-325 MG PO TABS: 1.0000 | ORAL_TABLET | ORAL | 0 refills | Status: DC | PRN

## 2022-08-04 MED ORDER — CLINDAMYCIN HCL 300 MG PO CAPS: 300.0000 mg | ORAL_CAPSULE | Freq: Three times a day (TID) | ORAL | 0 refills | Status: AC

## 2022-08-04 MED ORDER — HYDROCODONE-ACETAMINOPHEN 5-325 MG PO TABS: 1.0000 | ORAL_TABLET | ORAL | 0 refills | Status: AC | PRN

## 2022-08-04 MED ORDER — ONDANSETRON 4 MG PO TBDP: 4.0000 mg | ORAL_TABLET | Freq: Three times a day (TID) | ORAL | 0 refills | Status: AC | PRN

## 2022-08-04 MED ORDER — CLINDAMYCIN HCL 300 MG PO CAPS: 300.0000 mg | ORAL_CAPSULE | Freq: Three times a day (TID) | ORAL | 0 refills | Status: DC

## 2022-08-04 MED ORDER — ONDANSETRON 4 MG PO TBDP: 4.0000 mg | ORAL_TABLET | Freq: Three times a day (TID) | ORAL | 0 refills | Status: DC | PRN

## 2022-08-04 NOTE — ED Notes (Signed)
Discharge paperwork given and verbally understood. 

## 2022-08-04 NOTE — Discharge Instructions (Addendum)
Schedule to see the Dentist for evaluation.  °

## 2022-08-04 NOTE — ED Triage Notes (Signed)
Pt c/o left sided dental pain that has been going on for two weeks.

## 2022-08-04 NOTE — ED Provider Notes (Signed)
[place] [facility] AT [facility] Provider Note   CSN: [id number] Arrival date & time: 08/04/22  1700     History  Chief Complaint  Patient presents with   Dental Pain    John Pierce is a 42 y.o. male.  Patient complains of a tooth ache.  Patient thinks that he has an abscess.  Patient states that he has been taking Tylenol and ibuprofen but is not getting pain relief and has severe pain.  The history is provided by the patient. No language interpreter was used.  Dental Pain      Home Medications Prior to Admission medications   Medication Sig Start Date End Date Taking? Authorizing Provider  clindamycin (CLEOCIN) 300 MG capsule Take 1 capsule (300 mg total) by mouth 3 (three) times daily for 10 days. 08/04/22 08/14/22 Yes Elson Areas, PA-C  HYDROcodone-acetaminophen (NORCO/VICODIN) 5-325 MG tablet Take 1 tablet by mouth every 4 (four) hours as needed for moderate pain. 08/04/22 08/04/23 Yes Elson Areas, PA-C  ALPRAZolam (XANAX XR) 0.5 MG 24 hr tablet Take 0.5 mg by mouth as needed for anxiety (as needed).     [provider]  amphetamine-dextroamphetamine (ADDERALL XR) 30 MG 24 hr capsule Take 30 mg 2 (two) times daily by mouth. 1 tab in AM, 1 tab at noon, and half tab afternoon    [provider]  ondansetron (ZOFRAN ODT) 8 MG disintegrating tablet Take 1 tablet (8 mg total) by mouth every 8 (eight) hours as needed for nausea or vomiting. 05/11/19   Molpus, Jonny Ruiz, MD  oxyCODONE-acetaminophen (PERCOCET) 10-325 MG tablet Take 1 tablet by mouth every 4 (four) hours as needed for pain. 05/11/19   Molpus, John, MD  senna (SENOKOT) 8.6 MG TABS tablet Take 2 tablets (17.2 mg total) by mouth at bedtime. 01/17/19   Swinteck, Arlys John, MD  sildenafil (REVATIO) 20 MG tablet Take 20 mg by mouth See admin instructions. Take 1 hour prior to sexual activity 01/19/18   [provider]      Allergies    Keflex [cephalexin]    Review of  Systems   Review of Systems  All other systems reviewed and are negative.   Physical Exam Updated Vital Signs BP (!) 158/92   Pulse 65   Temp 98.1 F (36.7 C) (Oral)   Resp 16   Ht 5\' 7"  (1.702 m)   Wt 73.5 kg   SpO2 100%   BMI 25.37 kg/m  Physical Exam Vitals reviewed.  Constitutional:      Appearance: Normal appearance.  HENT:     Mouth/Throat:     Mouth: Mucous membranes are moist.     Comments: Left first molar higher than other teeth loose swelling to gumline Cardiovascular:     Rate and Rhythm: Normal rate.  Pulmonary:     Effort: Pulmonary effort is normal.  Skin:    General: Skin is warm.  Neurological:     General: No focal deficit present.     Mental Status: He is alert.  Psychiatric:        Mood and Affect: Mood normal.     ED Results / Procedures / Treatments   Labs (all labs ordered are listed, but only abnormal results are displayed) Labs Reviewed - No data to display  EKG None  Radiology No results found.  Procedures Procedures    Medications Ordered in ED Medications - No data to display  ED Course/ Medical Decision Making/ A&P  Medical Decision Making Complains of a tooth ache.  Patient reports he is supposed to follow-up with dentistry  Risk Prescription drug management. Risk Details: Patient is given a prescription for clindamycin he is given a prescription for 10 hydrocodone he is advised he needs to see dentistry for evaluation.           Final Clinical Impression(s) / ED Diagnoses Final diagnoses:  Toothache    Rx / DC Orders ED Discharge Orders          Ordered    clindamycin (CLEOCIN) 300 MG capsule  3 times daily        08/04/22 1738    HYDROcodone-acetaminophen (NORCO/VICODIN) 5-325 MG tablet  Every 4 hours PRN        08/04/22 1738           An After Visit Summary was printed and given to the patient.    Elson Areas, PA-C 08/04/22 1741    Elayne Snare  K, DO 08/04/22 2332
